# Patient Record
Sex: Female | Born: 1945
Health system: Southern US, Community
[De-identification: ages and names within clinical notes are randomized; demographics above are authoritative.]

## PROBLEM LIST (undated history)

## (undated) DIAGNOSIS — I6782 Cerebral ischemia: Secondary | ICD-10-CM

## (undated) DIAGNOSIS — D329 Benign neoplasm of meninges, unspecified: Secondary | ICD-10-CM

## (undated) DIAGNOSIS — E785 Hyperlipidemia, unspecified: Secondary | ICD-10-CM

## (undated) DIAGNOSIS — E039 Hypothyroidism, unspecified: Secondary | ICD-10-CM

## (undated) DIAGNOSIS — M8580 Other specified disorders of bone density and structure, unspecified site: Secondary | ICD-10-CM

## (undated) DIAGNOSIS — E079 Disorder of thyroid, unspecified: Secondary | ICD-10-CM

## (undated) DIAGNOSIS — I700 Atherosclerosis of aorta: Secondary | ICD-10-CM

## (undated) DIAGNOSIS — I10 Essential (primary) hypertension: Secondary | ICD-10-CM

## (undated) HISTORY — DX: Benign neoplasm of meninges, unspecified: D32.9

## (undated) HISTORY — DX: Essential (primary) hypertension: I10

## (undated) HISTORY — DX: Atherosclerosis of aorta: I70.0

## (undated) HISTORY — DX: Hyperlipidemia, unspecified: E78.5

## (undated) HISTORY — DX: Cerebral ischemia: I67.82

## (undated) HISTORY — PX: BREAST LUMPECTOMY WITH NEEDLE LOCALIZATION AND AXILLARY SENTINEL LYMPH NODE BX: SHX5760

## (undated) HISTORY — DX: Other specified disorders of bone density and structure, unspecified site: M85.80

## (undated) HISTORY — DX: Hypothyroidism, unspecified: E03.9

## (undated) HISTORY — DX: Disorder of thyroid, unspecified: E07.9

---

## 1997-12-11 ENCOUNTER — Other Ambulatory Visit: Admission: RE | Admit: 1997-12-11 | Discharge: 1997-12-11 | Payer: Self-pay | Admitting: Gynecology

## 1999-01-04 ENCOUNTER — Other Ambulatory Visit: Admission: RE | Admit: 1999-01-04 | Discharge: 1999-01-04 | Payer: Self-pay | Admitting: Gynecology

## 2000-01-13 ENCOUNTER — Other Ambulatory Visit: Admission: RE | Admit: 2000-01-13 | Discharge: 2000-01-13 | Payer: Self-pay | Admitting: Obstetrics and Gynecology

## 2000-01-25 ENCOUNTER — Ambulatory Visit (HOSPITAL_COMMUNITY): Admission: RE | Admit: 2000-01-25 | Discharge: 2000-01-25 | Payer: Self-pay | Admitting: Obstetrics and Gynecology

## 2000-01-25 ENCOUNTER — Encounter: Payer: Self-pay | Admitting: Obstetrics and Gynecology

## 2000-12-28 ENCOUNTER — Other Ambulatory Visit: Admission: RE | Admit: 2000-12-28 | Discharge: 2000-12-28 | Payer: Self-pay | Admitting: Gynecology

## 2002-08-19 ENCOUNTER — Other Ambulatory Visit: Admission: RE | Admit: 2002-08-19 | Discharge: 2002-08-19 | Payer: Self-pay | Admitting: Gynecology

## 2003-10-21 ENCOUNTER — Other Ambulatory Visit: Admission: RE | Admit: 2003-10-21 | Discharge: 2003-10-21 | Payer: Self-pay | Admitting: Gynecology

## 2005-03-10 ENCOUNTER — Other Ambulatory Visit: Admission: RE | Admit: 2005-03-10 | Discharge: 2005-03-10 | Payer: Self-pay | Admitting: Gynecology

## 2012-04-20 DIAGNOSIS — M899 Disorder of bone, unspecified: Secondary | ICD-10-CM | POA: Diagnosis not present

## 2012-04-20 DIAGNOSIS — Z1231 Encounter for screening mammogram for malignant neoplasm of breast: Secondary | ICD-10-CM | POA: Diagnosis not present

## 2012-04-20 DIAGNOSIS — Z13820 Encounter for screening for osteoporosis: Secondary | ICD-10-CM | POA: Diagnosis not present

## 2012-04-20 DIAGNOSIS — M949 Disorder of cartilage, unspecified: Secondary | ICD-10-CM | POA: Diagnosis not present

## 2012-05-28 DIAGNOSIS — Z23 Encounter for immunization: Secondary | ICD-10-CM | POA: Diagnosis not present

## 2012-06-07 DIAGNOSIS — E785 Hyperlipidemia, unspecified: Secondary | ICD-10-CM | POA: Diagnosis not present

## 2012-06-07 DIAGNOSIS — M899 Disorder of bone, unspecified: Secondary | ICD-10-CM | POA: Diagnosis not present

## 2012-06-07 DIAGNOSIS — Z79899 Other long term (current) drug therapy: Secondary | ICD-10-CM | POA: Diagnosis not present

## 2012-06-07 DIAGNOSIS — M949 Disorder of cartilage, unspecified: Secondary | ICD-10-CM | POA: Diagnosis not present

## 2012-06-07 DIAGNOSIS — D72819 Decreased white blood cell count, unspecified: Secondary | ICD-10-CM | POA: Diagnosis not present

## 2012-07-26 DIAGNOSIS — I10 Essential (primary) hypertension: Secondary | ICD-10-CM | POA: Diagnosis not present

## 2012-07-26 DIAGNOSIS — Z79899 Other long term (current) drug therapy: Secondary | ICD-10-CM | POA: Diagnosis not present

## 2012-07-26 DIAGNOSIS — E785 Hyperlipidemia, unspecified: Secondary | ICD-10-CM | POA: Diagnosis not present

## 2012-08-23 DIAGNOSIS — H04129 Dry eye syndrome of unspecified lacrimal gland: Secondary | ICD-10-CM | POA: Diagnosis not present

## 2012-08-23 DIAGNOSIS — H524 Presbyopia: Secondary | ICD-10-CM | POA: Diagnosis not present

## 2012-12-06 DIAGNOSIS — E785 Hyperlipidemia, unspecified: Secondary | ICD-10-CM | POA: Diagnosis not present

## 2012-12-06 DIAGNOSIS — I10 Essential (primary) hypertension: Secondary | ICD-10-CM | POA: Diagnosis not present

## 2012-12-06 DIAGNOSIS — Z79899 Other long term (current) drug therapy: Secondary | ICD-10-CM | POA: Diagnosis not present

## 2013-04-18 DIAGNOSIS — M899 Disorder of bone, unspecified: Secondary | ICD-10-CM | POA: Diagnosis not present

## 2013-04-18 DIAGNOSIS — E785 Hyperlipidemia, unspecified: Secondary | ICD-10-CM | POA: Diagnosis not present

## 2013-04-18 DIAGNOSIS — Z1231 Encounter for screening mammogram for malignant neoplasm of breast: Secondary | ICD-10-CM | POA: Diagnosis not present

## 2013-04-18 DIAGNOSIS — Z79899 Other long term (current) drug therapy: Secondary | ICD-10-CM | POA: Diagnosis not present

## 2013-04-18 DIAGNOSIS — I10 Essential (primary) hypertension: Secondary | ICD-10-CM | POA: Diagnosis not present

## 2013-04-18 DIAGNOSIS — Z0000 Encounter for general adult medical examination without abnormal findings: Secondary | ICD-10-CM | POA: Diagnosis not present

## 2013-04-18 DIAGNOSIS — Z124 Encounter for screening for malignant neoplasm of cervix: Secondary | ICD-10-CM | POA: Diagnosis not present

## 2013-04-18 DIAGNOSIS — R7989 Other specified abnormal findings of blood chemistry: Secondary | ICD-10-CM | POA: Diagnosis not present

## 2013-05-30 DIAGNOSIS — Z23 Encounter for immunization: Secondary | ICD-10-CM | POA: Diagnosis not present

## 2013-05-31 DIAGNOSIS — M810 Age-related osteoporosis without current pathological fracture: Secondary | ICD-10-CM | POA: Diagnosis not present

## 2013-05-31 DIAGNOSIS — Z1231 Encounter for screening mammogram for malignant neoplasm of breast: Secondary | ICD-10-CM | POA: Diagnosis not present

## 2013-05-31 DIAGNOSIS — M899 Disorder of bone, unspecified: Secondary | ICD-10-CM | POA: Diagnosis not present

## 2013-06-25 DIAGNOSIS — M810 Age-related osteoporosis without current pathological fracture: Secondary | ICD-10-CM | POA: Diagnosis not present

## 2013-08-22 DIAGNOSIS — E785 Hyperlipidemia, unspecified: Secondary | ICD-10-CM | POA: Diagnosis not present

## 2013-08-22 DIAGNOSIS — Z79899 Other long term (current) drug therapy: Secondary | ICD-10-CM | POA: Diagnosis not present

## 2013-08-22 DIAGNOSIS — I10 Essential (primary) hypertension: Secondary | ICD-10-CM | POA: Diagnosis not present

## 2013-08-22 DIAGNOSIS — M810 Age-related osteoporosis without current pathological fracture: Secondary | ICD-10-CM | POA: Diagnosis not present

## 2013-08-26 DIAGNOSIS — E875 Hyperkalemia: Secondary | ICD-10-CM | POA: Diagnosis not present

## 2013-11-18 DIAGNOSIS — J069 Acute upper respiratory infection, unspecified: Secondary | ICD-10-CM | POA: Diagnosis not present

## 2013-11-25 DIAGNOSIS — N390 Urinary tract infection, site not specified: Secondary | ICD-10-CM | POA: Diagnosis not present

## 2014-02-26 DIAGNOSIS — H547 Unspecified visual loss: Secondary | ICD-10-CM | POA: Diagnosis not present

## 2014-02-26 DIAGNOSIS — H2589 Other age-related cataract: Secondary | ICD-10-CM | POA: Diagnosis not present

## 2014-02-26 DIAGNOSIS — H04129 Dry eye syndrome of unspecified lacrimal gland: Secondary | ICD-10-CM | POA: Diagnosis not present

## 2014-05-29 DIAGNOSIS — M549 Dorsalgia, unspecified: Secondary | ICD-10-CM | POA: Diagnosis not present

## 2014-05-29 DIAGNOSIS — M810 Age-related osteoporosis without current pathological fracture: Secondary | ICD-10-CM | POA: Diagnosis not present

## 2014-05-29 DIAGNOSIS — Z1231 Encounter for screening mammogram for malignant neoplasm of breast: Secondary | ICD-10-CM | POA: Diagnosis not present

## 2014-06-13 DIAGNOSIS — Z1231 Encounter for screening mammogram for malignant neoplasm of breast: Secondary | ICD-10-CM | POA: Diagnosis not present

## 2014-06-13 DIAGNOSIS — M810 Age-related osteoporosis without current pathological fracture: Secondary | ICD-10-CM | POA: Diagnosis not present

## 2014-06-13 DIAGNOSIS — Z780 Asymptomatic menopausal state: Secondary | ICD-10-CM | POA: Diagnosis not present

## 2014-06-27 DIAGNOSIS — Z23 Encounter for immunization: Secondary | ICD-10-CM | POA: Diagnosis not present

## 2014-10-09 DIAGNOSIS — R7301 Impaired fasting glucose: Secondary | ICD-10-CM | POA: Diagnosis not present

## 2014-10-09 DIAGNOSIS — M8580 Other specified disorders of bone density and structure, unspecified site: Secondary | ICD-10-CM | POA: Diagnosis not present

## 2014-10-09 DIAGNOSIS — E785 Hyperlipidemia, unspecified: Secondary | ICD-10-CM | POA: Diagnosis not present

## 2014-10-09 DIAGNOSIS — I10 Essential (primary) hypertension: Secondary | ICD-10-CM | POA: Diagnosis not present

## 2014-10-09 DIAGNOSIS — Z79899 Other long term (current) drug therapy: Secondary | ICD-10-CM | POA: Diagnosis not present

## 2014-12-22 DIAGNOSIS — R946 Abnormal results of thyroid function studies: Secondary | ICD-10-CM | POA: Diagnosis not present

## 2014-12-22 DIAGNOSIS — D72819 Decreased white blood cell count, unspecified: Secondary | ICD-10-CM | POA: Diagnosis not present

## 2014-12-30 DIAGNOSIS — R946 Abnormal results of thyroid function studies: Secondary | ICD-10-CM | POA: Diagnosis not present

## 2015-03-04 DIAGNOSIS — H2513 Age-related nuclear cataract, bilateral: Secondary | ICD-10-CM | POA: Diagnosis not present

## 2015-04-09 DIAGNOSIS — R7301 Impaired fasting glucose: Secondary | ICD-10-CM | POA: Diagnosis not present

## 2015-04-09 DIAGNOSIS — R946 Abnormal results of thyroid function studies: Secondary | ICD-10-CM | POA: Diagnosis not present

## 2015-04-09 DIAGNOSIS — E785 Hyperlipidemia, unspecified: Secondary | ICD-10-CM | POA: Diagnosis not present

## 2015-04-09 DIAGNOSIS — Z79899 Other long term (current) drug therapy: Secondary | ICD-10-CM | POA: Diagnosis not present

## 2015-04-09 DIAGNOSIS — I10 Essential (primary) hypertension: Secondary | ICD-10-CM | POA: Diagnosis not present

## 2015-04-22 DIAGNOSIS — Z23 Encounter for immunization: Secondary | ICD-10-CM | POA: Diagnosis not present

## 2015-04-28 DIAGNOSIS — Z1211 Encounter for screening for malignant neoplasm of colon: Secondary | ICD-10-CM | POA: Diagnosis not present

## 2015-04-28 DIAGNOSIS — Z1212 Encounter for screening for malignant neoplasm of rectum: Secondary | ICD-10-CM | POA: Diagnosis not present

## 2015-05-13 DIAGNOSIS — D1801 Hemangioma of skin and subcutaneous tissue: Secondary | ICD-10-CM | POA: Diagnosis not present

## 2015-05-13 DIAGNOSIS — B351 Tinea unguium: Secondary | ICD-10-CM | POA: Diagnosis not present

## 2015-05-13 DIAGNOSIS — D225 Melanocytic nevi of trunk: Secondary | ICD-10-CM | POA: Diagnosis not present

## 2015-05-13 DIAGNOSIS — D2239 Melanocytic nevi of other parts of face: Secondary | ICD-10-CM | POA: Diagnosis not present

## 2015-05-13 DIAGNOSIS — L820 Inflamed seborrheic keratosis: Secondary | ICD-10-CM | POA: Diagnosis not present

## 2015-06-04 DIAGNOSIS — R859 Unspecified abnormal finding in specimens from digestive organs and abdominal cavity: Secondary | ICD-10-CM | POA: Diagnosis not present

## 2015-06-26 DIAGNOSIS — I10 Essential (primary) hypertension: Secondary | ICD-10-CM | POA: Diagnosis not present

## 2015-06-26 DIAGNOSIS — Z1211 Encounter for screening for malignant neoplasm of colon: Secondary | ICD-10-CM | POA: Diagnosis not present

## 2015-07-10 DIAGNOSIS — J209 Acute bronchitis, unspecified: Secondary | ICD-10-CM | POA: Diagnosis not present

## 2015-07-10 DIAGNOSIS — J0100 Acute maxillary sinusitis, unspecified: Secondary | ICD-10-CM | POA: Diagnosis not present

## 2015-07-30 DIAGNOSIS — Z23 Encounter for immunization: Secondary | ICD-10-CM | POA: Diagnosis not present

## 2015-10-01 DIAGNOSIS — Z1231 Encounter for screening mammogram for malignant neoplasm of breast: Secondary | ICD-10-CM | POA: Diagnosis not present

## 2015-10-07 DIAGNOSIS — R928 Other abnormal and inconclusive findings on diagnostic imaging of breast: Secondary | ICD-10-CM | POA: Diagnosis not present

## 2015-10-12 DIAGNOSIS — E785 Hyperlipidemia, unspecified: Secondary | ICD-10-CM | POA: Diagnosis not present

## 2015-10-12 DIAGNOSIS — Z79899 Other long term (current) drug therapy: Secondary | ICD-10-CM | POA: Diagnosis not present

## 2015-10-12 DIAGNOSIS — I10 Essential (primary) hypertension: Secondary | ICD-10-CM | POA: Diagnosis not present

## 2016-01-14 DIAGNOSIS — E039 Hypothyroidism, unspecified: Secondary | ICD-10-CM | POA: Diagnosis not present

## 2016-01-14 DIAGNOSIS — K130 Diseases of lips: Secondary | ICD-10-CM | POA: Diagnosis not present

## 2016-01-28 DIAGNOSIS — Z79899 Other long term (current) drug therapy: Secondary | ICD-10-CM | POA: Diagnosis not present

## 2016-01-28 DIAGNOSIS — R946 Abnormal results of thyroid function studies: Secondary | ICD-10-CM | POA: Diagnosis not present

## 2016-01-28 DIAGNOSIS — E785 Hyperlipidemia, unspecified: Secondary | ICD-10-CM | POA: Diagnosis not present

## 2016-03-24 DIAGNOSIS — H2513 Age-related nuclear cataract, bilateral: Secondary | ICD-10-CM | POA: Diagnosis not present

## 2016-03-24 DIAGNOSIS — H524 Presbyopia: Secondary | ICD-10-CM | POA: Diagnosis not present

## 2016-04-12 DIAGNOSIS — R946 Abnormal results of thyroid function studies: Secondary | ICD-10-CM | POA: Diagnosis not present

## 2016-04-12 DIAGNOSIS — M810 Age-related osteoporosis without current pathological fracture: Secondary | ICD-10-CM | POA: Diagnosis not present

## 2016-04-12 DIAGNOSIS — E785 Hyperlipidemia, unspecified: Secondary | ICD-10-CM | POA: Diagnosis not present

## 2016-04-12 DIAGNOSIS — Z79899 Other long term (current) drug therapy: Secondary | ICD-10-CM | POA: Diagnosis not present

## 2016-04-12 DIAGNOSIS — R7301 Impaired fasting glucose: Secondary | ICD-10-CM | POA: Diagnosis not present

## 2016-04-12 DIAGNOSIS — I10 Essential (primary) hypertension: Secondary | ICD-10-CM | POA: Diagnosis not present

## 2016-04-13 ENCOUNTER — Other Ambulatory Visit: Payer: Self-pay

## 2016-05-26 DIAGNOSIS — D225 Melanocytic nevi of trunk: Secondary | ICD-10-CM | POA: Diagnosis not present

## 2016-05-26 DIAGNOSIS — D2239 Melanocytic nevi of other parts of face: Secondary | ICD-10-CM | POA: Diagnosis not present

## 2016-05-26 DIAGNOSIS — D1801 Hemangioma of skin and subcutaneous tissue: Secondary | ICD-10-CM | POA: Diagnosis not present

## 2016-05-26 DIAGNOSIS — L821 Other seborrheic keratosis: Secondary | ICD-10-CM | POA: Diagnosis not present

## 2016-05-26 DIAGNOSIS — L820 Inflamed seborrheic keratosis: Secondary | ICD-10-CM | POA: Diagnosis not present

## 2016-10-06 DIAGNOSIS — Z1231 Encounter for screening mammogram for malignant neoplasm of breast: Secondary | ICD-10-CM | POA: Diagnosis not present

## 2016-10-06 DIAGNOSIS — E785 Hyperlipidemia, unspecified: Secondary | ICD-10-CM | POA: Diagnosis not present

## 2016-10-06 DIAGNOSIS — M8580 Other specified disorders of bone density and structure, unspecified site: Secondary | ICD-10-CM | POA: Diagnosis not present

## 2016-10-06 DIAGNOSIS — E2839 Other primary ovarian failure: Secondary | ICD-10-CM | POA: Diagnosis not present

## 2016-10-06 DIAGNOSIS — I10 Essential (primary) hypertension: Secondary | ICD-10-CM | POA: Diagnosis not present

## 2016-10-06 DIAGNOSIS — R946 Abnormal results of thyroid function studies: Secondary | ICD-10-CM | POA: Diagnosis not present

## 2016-10-06 DIAGNOSIS — Z79899 Other long term (current) drug therapy: Secondary | ICD-10-CM | POA: Diagnosis not present

## 2016-10-06 DIAGNOSIS — R7301 Impaired fasting glucose: Secondary | ICD-10-CM | POA: Diagnosis not present

## 2016-10-21 DIAGNOSIS — M85852 Other specified disorders of bone density and structure, left thigh: Secondary | ICD-10-CM | POA: Diagnosis not present

## 2016-10-21 DIAGNOSIS — E2839 Other primary ovarian failure: Secondary | ICD-10-CM | POA: Diagnosis not present

## 2016-10-21 DIAGNOSIS — M8580 Other specified disorders of bone density and structure, unspecified site: Secondary | ICD-10-CM | POA: Diagnosis not present

## 2016-10-21 DIAGNOSIS — Z1231 Encounter for screening mammogram for malignant neoplasm of breast: Secondary | ICD-10-CM | POA: Diagnosis not present

## 2016-11-03 DIAGNOSIS — N6489 Other specified disorders of breast: Secondary | ICD-10-CM | POA: Diagnosis not present

## 2016-11-03 DIAGNOSIS — C50311 Malignant neoplasm of lower-inner quadrant of right female breast: Secondary | ICD-10-CM | POA: Diagnosis not present

## 2016-11-03 DIAGNOSIS — N6314 Unspecified lump in the right breast, lower inner quadrant: Secondary | ICD-10-CM | POA: Diagnosis not present

## 2016-11-03 DIAGNOSIS — R928 Other abnormal and inconclusive findings on diagnostic imaging of breast: Secondary | ICD-10-CM | POA: Diagnosis not present

## 2016-11-04 DIAGNOSIS — C50311 Malignant neoplasm of lower-inner quadrant of right female breast: Secondary | ICD-10-CM

## 2016-11-04 HISTORY — DX: Malignant neoplasm of lower-inner quadrant of right female breast: C50.311

## 2016-11-09 DIAGNOSIS — C50911 Malignant neoplasm of unspecified site of right female breast: Secondary | ICD-10-CM | POA: Diagnosis not present

## 2016-11-15 DIAGNOSIS — Z171 Estrogen receptor negative status [ER-]: Secondary | ICD-10-CM | POA: Diagnosis not present

## 2016-11-15 DIAGNOSIS — C50311 Malignant neoplasm of lower-inner quadrant of right female breast: Secondary | ICD-10-CM | POA: Diagnosis not present

## 2016-11-30 DIAGNOSIS — Z01810 Encounter for preprocedural cardiovascular examination: Secondary | ICD-10-CM | POA: Diagnosis not present

## 2016-11-30 DIAGNOSIS — E079 Disorder of thyroid, unspecified: Secondary | ICD-10-CM | POA: Diagnosis not present

## 2016-11-30 DIAGNOSIS — Z171 Estrogen receptor negative status [ER-]: Secondary | ICD-10-CM | POA: Diagnosis not present

## 2016-11-30 DIAGNOSIS — E7800 Pure hypercholesterolemia, unspecified: Secondary | ICD-10-CM | POA: Diagnosis not present

## 2016-11-30 DIAGNOSIS — Z7982 Long term (current) use of aspirin: Secondary | ICD-10-CM | POA: Diagnosis not present

## 2016-11-30 DIAGNOSIS — C50311 Malignant neoplasm of lower-inner quadrant of right female breast: Secondary | ICD-10-CM | POA: Diagnosis not present

## 2016-11-30 DIAGNOSIS — R928 Other abnormal and inconclusive findings on diagnostic imaging of breast: Secondary | ICD-10-CM | POA: Diagnosis not present

## 2016-11-30 DIAGNOSIS — Z87891 Personal history of nicotine dependence: Secondary | ICD-10-CM | POA: Diagnosis not present

## 2016-11-30 DIAGNOSIS — C50911 Malignant neoplasm of unspecified site of right female breast: Secondary | ICD-10-CM | POA: Diagnosis not present

## 2016-11-30 DIAGNOSIS — I10 Essential (primary) hypertension: Secondary | ICD-10-CM | POA: Diagnosis not present

## 2016-12-16 DIAGNOSIS — E039 Hypothyroidism, unspecified: Secondary | ICD-10-CM | POA: Diagnosis not present

## 2016-12-16 DIAGNOSIS — I10 Essential (primary) hypertension: Secondary | ICD-10-CM | POA: Diagnosis not present

## 2016-12-16 DIAGNOSIS — E785 Hyperlipidemia, unspecified: Secondary | ICD-10-CM | POA: Diagnosis not present

## 2016-12-16 DIAGNOSIS — Z171 Estrogen receptor negative status [ER-]: Secondary | ICD-10-CM | POA: Diagnosis not present

## 2016-12-16 DIAGNOSIS — M8580 Other specified disorders of bone density and structure, unspecified site: Secondary | ICD-10-CM | POA: Diagnosis not present

## 2016-12-16 DIAGNOSIS — C50311 Malignant neoplasm of lower-inner quadrant of right female breast: Secondary | ICD-10-CM | POA: Diagnosis not present

## 2016-12-22 DIAGNOSIS — Z7982 Long term (current) use of aspirin: Secondary | ICD-10-CM | POA: Diagnosis not present

## 2016-12-22 DIAGNOSIS — I10 Essential (primary) hypertension: Secondary | ICD-10-CM | POA: Diagnosis not present

## 2016-12-22 DIAGNOSIS — E079 Disorder of thyroid, unspecified: Secondary | ICD-10-CM | POA: Diagnosis not present

## 2016-12-22 DIAGNOSIS — C50911 Malignant neoplasm of unspecified site of right female breast: Secondary | ICD-10-CM | POA: Diagnosis not present

## 2016-12-22 DIAGNOSIS — Z79899 Other long term (current) drug therapy: Secondary | ICD-10-CM | POA: Diagnosis not present

## 2016-12-22 DIAGNOSIS — E785 Hyperlipidemia, unspecified: Secondary | ICD-10-CM | POA: Diagnosis not present

## 2016-12-22 DIAGNOSIS — Z9889 Other specified postprocedural states: Secondary | ICD-10-CM | POA: Diagnosis not present

## 2016-12-22 DIAGNOSIS — C50311 Malignant neoplasm of lower-inner quadrant of right female breast: Secondary | ICD-10-CM | POA: Diagnosis not present

## 2016-12-26 DIAGNOSIS — C50311 Malignant neoplasm of lower-inner quadrant of right female breast: Secondary | ICD-10-CM | POA: Diagnosis not present

## 2016-12-28 DIAGNOSIS — Z171 Estrogen receptor negative status [ER-]: Secondary | ICD-10-CM | POA: Diagnosis not present

## 2016-12-28 DIAGNOSIS — C50311 Malignant neoplasm of lower-inner quadrant of right female breast: Secondary | ICD-10-CM | POA: Diagnosis not present

## 2016-12-28 DIAGNOSIS — Z7982 Long term (current) use of aspirin: Secondary | ICD-10-CM | POA: Diagnosis not present

## 2016-12-28 DIAGNOSIS — I10 Essential (primary) hypertension: Secondary | ICD-10-CM | POA: Diagnosis not present

## 2016-12-28 DIAGNOSIS — Z452 Encounter for adjustment and management of vascular access device: Secondary | ICD-10-CM | POA: Diagnosis not present

## 2016-12-28 DIAGNOSIS — E7800 Pure hypercholesterolemia, unspecified: Secondary | ICD-10-CM | POA: Diagnosis not present

## 2016-12-28 DIAGNOSIS — C50911 Malignant neoplasm of unspecified site of right female breast: Secondary | ICD-10-CM | POA: Diagnosis not present

## 2016-12-28 DIAGNOSIS — E039 Hypothyroidism, unspecified: Secondary | ICD-10-CM | POA: Diagnosis not present

## 2016-12-28 DIAGNOSIS — Z79899 Other long term (current) drug therapy: Secondary | ICD-10-CM | POA: Diagnosis not present

## 2016-12-30 DIAGNOSIS — C50311 Malignant neoplasm of lower-inner quadrant of right female breast: Secondary | ICD-10-CM | POA: Diagnosis not present

## 2016-12-30 DIAGNOSIS — Z5111 Encounter for antineoplastic chemotherapy: Secondary | ICD-10-CM | POA: Diagnosis not present

## 2017-01-06 DIAGNOSIS — C50911 Malignant neoplasm of unspecified site of right female breast: Secondary | ICD-10-CM | POA: Diagnosis not present

## 2017-01-06 DIAGNOSIS — C50311 Malignant neoplasm of lower-inner quadrant of right female breast: Secondary | ICD-10-CM | POA: Diagnosis not present

## 2017-01-06 DIAGNOSIS — Z171 Estrogen receptor negative status [ER-]: Secondary | ICD-10-CM | POA: Diagnosis not present

## 2017-01-20 DIAGNOSIS — R0609 Other forms of dyspnea: Secondary | ICD-10-CM | POA: Diagnosis not present

## 2017-01-20 DIAGNOSIS — R079 Chest pain, unspecified: Secondary | ICD-10-CM | POA: Diagnosis not present

## 2017-01-20 DIAGNOSIS — D708 Other neutropenia: Secondary | ICD-10-CM | POA: Diagnosis not present

## 2017-01-20 DIAGNOSIS — Z5111 Encounter for antineoplastic chemotherapy: Secondary | ICD-10-CM | POA: Diagnosis not present

## 2017-01-20 DIAGNOSIS — R42 Dizziness and giddiness: Secondary | ICD-10-CM | POA: Diagnosis not present

## 2017-01-20 DIAGNOSIS — C50911 Malignant neoplasm of unspecified site of right female breast: Secondary | ICD-10-CM | POA: Diagnosis not present

## 2017-01-24 DIAGNOSIS — R0602 Shortness of breath: Secondary | ICD-10-CM | POA: Diagnosis not present

## 2017-01-24 DIAGNOSIS — E785 Hyperlipidemia, unspecified: Secondary | ICD-10-CM | POA: Diagnosis not present

## 2017-01-25 DIAGNOSIS — I700 Atherosclerosis of aorta: Secondary | ICD-10-CM | POA: Diagnosis not present

## 2017-01-25 DIAGNOSIS — I2699 Other pulmonary embolism without acute cor pulmonale: Secondary | ICD-10-CM | POA: Diagnosis not present

## 2017-01-25 DIAGNOSIS — R0602 Shortness of breath: Secondary | ICD-10-CM | POA: Diagnosis not present

## 2017-01-25 DIAGNOSIS — Z79899 Other long term (current) drug therapy: Secondary | ICD-10-CM | POA: Diagnosis not present

## 2017-01-25 DIAGNOSIS — R7989 Other specified abnormal findings of blood chemistry: Secondary | ICD-10-CM | POA: Diagnosis not present

## 2017-01-26 DIAGNOSIS — I2699 Other pulmonary embolism without acute cor pulmonale: Secondary | ICD-10-CM | POA: Diagnosis not present

## 2017-02-04 DIAGNOSIS — I2699 Other pulmonary embolism without acute cor pulmonale: Secondary | ICD-10-CM

## 2017-02-04 HISTORY — DX: Other pulmonary embolism without acute cor pulmonale: I26.99

## 2017-02-10 DIAGNOSIS — C50311 Malignant neoplasm of lower-inner quadrant of right female breast: Secondary | ICD-10-CM | POA: Diagnosis not present

## 2017-02-10 DIAGNOSIS — Z7901 Long term (current) use of anticoagulants: Secondary | ICD-10-CM | POA: Diagnosis not present

## 2017-02-10 DIAGNOSIS — I2699 Other pulmonary embolism without acute cor pulmonale: Secondary | ICD-10-CM | POA: Diagnosis not present

## 2017-03-03 DIAGNOSIS — D6481 Anemia due to antineoplastic chemotherapy: Secondary | ICD-10-CM | POA: Diagnosis not present

## 2017-03-03 DIAGNOSIS — D649 Anemia, unspecified: Secondary | ICD-10-CM | POA: Diagnosis not present

## 2017-03-03 DIAGNOSIS — I2699 Other pulmonary embolism without acute cor pulmonale: Secondary | ICD-10-CM | POA: Diagnosis not present

## 2017-03-03 DIAGNOSIS — C50311 Malignant neoplasm of lower-inner quadrant of right female breast: Secondary | ICD-10-CM | POA: Diagnosis not present

## 2017-03-03 DIAGNOSIS — C50911 Malignant neoplasm of unspecified site of right female breast: Secondary | ICD-10-CM | POA: Diagnosis not present

## 2017-03-03 DIAGNOSIS — N61 Mastitis without abscess: Secondary | ICD-10-CM | POA: Diagnosis not present

## 2017-03-14 DIAGNOSIS — I2699 Other pulmonary embolism without acute cor pulmonale: Secondary | ICD-10-CM | POA: Diagnosis not present

## 2017-03-14 DIAGNOSIS — C50311 Malignant neoplasm of lower-inner quadrant of right female breast: Secondary | ICD-10-CM | POA: Diagnosis not present

## 2017-03-14 DIAGNOSIS — Z7901 Long term (current) use of anticoagulants: Secondary | ICD-10-CM | POA: Diagnosis not present

## 2017-03-21 DIAGNOSIS — Z171 Estrogen receptor negative status [ER-]: Secondary | ICD-10-CM | POA: Diagnosis not present

## 2017-03-21 DIAGNOSIS — I700 Atherosclerosis of aorta: Secondary | ICD-10-CM | POA: Diagnosis not present

## 2017-03-21 DIAGNOSIS — Z452 Encounter for adjustment and management of vascular access device: Secondary | ICD-10-CM | POA: Diagnosis not present

## 2017-03-21 DIAGNOSIS — C50311 Malignant neoplasm of lower-inner quadrant of right female breast: Secondary | ICD-10-CM | POA: Diagnosis not present

## 2017-03-21 DIAGNOSIS — R0602 Shortness of breath: Secondary | ICD-10-CM | POA: Diagnosis not present

## 2017-03-22 DIAGNOSIS — R0602 Shortness of breath: Secondary | ICD-10-CM | POA: Diagnosis not present

## 2017-03-22 DIAGNOSIS — C50311 Malignant neoplasm of lower-inner quadrant of right female breast: Secondary | ICD-10-CM | POA: Diagnosis not present

## 2017-03-23 DIAGNOSIS — C50311 Malignant neoplasm of lower-inner quadrant of right female breast: Secondary | ICD-10-CM | POA: Diagnosis not present

## 2017-03-23 DIAGNOSIS — R0602 Shortness of breath: Secondary | ICD-10-CM | POA: Diagnosis not present

## 2017-03-24 DIAGNOSIS — Z510 Encounter for antineoplastic radiation therapy: Secondary | ICD-10-CM | POA: Diagnosis not present

## 2017-03-24 DIAGNOSIS — C50311 Malignant neoplasm of lower-inner quadrant of right female breast: Secondary | ICD-10-CM | POA: Diagnosis not present

## 2017-03-27 DIAGNOSIS — C50311 Malignant neoplasm of lower-inner quadrant of right female breast: Secondary | ICD-10-CM | POA: Diagnosis not present

## 2017-03-31 DIAGNOSIS — Z510 Encounter for antineoplastic radiation therapy: Secondary | ICD-10-CM | POA: Diagnosis not present

## 2017-03-31 DIAGNOSIS — C50311 Malignant neoplasm of lower-inner quadrant of right female breast: Secondary | ICD-10-CM | POA: Diagnosis not present

## 2017-04-03 DIAGNOSIS — N61 Mastitis without abscess: Secondary | ICD-10-CM | POA: Diagnosis not present

## 2017-04-03 DIAGNOSIS — C50911 Malignant neoplasm of unspecified site of right female breast: Secondary | ICD-10-CM | POA: Diagnosis not present

## 2017-04-03 DIAGNOSIS — Z7901 Long term (current) use of anticoagulants: Secondary | ICD-10-CM | POA: Diagnosis not present

## 2017-04-03 DIAGNOSIS — Z86711 Personal history of pulmonary embolism: Secondary | ICD-10-CM

## 2017-04-03 DIAGNOSIS — Z9221 Personal history of antineoplastic chemotherapy: Secondary | ICD-10-CM

## 2017-04-03 DIAGNOSIS — R21 Rash and other nonspecific skin eruption: Secondary | ICD-10-CM | POA: Diagnosis not present

## 2017-04-03 DIAGNOSIS — F419 Anxiety disorder, unspecified: Secondary | ICD-10-CM | POA: Diagnosis not present

## 2017-04-03 DIAGNOSIS — C50311 Malignant neoplasm of lower-inner quadrant of right female breast: Secondary | ICD-10-CM | POA: Diagnosis not present

## 2017-04-03 DIAGNOSIS — Z171 Estrogen receptor negative status [ER-]: Secondary | ICD-10-CM | POA: Diagnosis not present

## 2017-04-04 DIAGNOSIS — M25511 Pain in right shoulder: Secondary | ICD-10-CM | POA: Diagnosis not present

## 2017-04-04 DIAGNOSIS — M25611 Stiffness of right shoulder, not elsewhere classified: Secondary | ICD-10-CM | POA: Diagnosis not present

## 2017-04-04 DIAGNOSIS — R2681 Unsteadiness on feet: Secondary | ICD-10-CM | POA: Diagnosis not present

## 2017-04-04 DIAGNOSIS — C50311 Malignant neoplasm of lower-inner quadrant of right female breast: Secondary | ICD-10-CM | POA: Diagnosis not present

## 2017-04-04 DIAGNOSIS — M6281 Muscle weakness (generalized): Secondary | ICD-10-CM | POA: Diagnosis not present

## 2017-04-04 DIAGNOSIS — R5383 Other fatigue: Secondary | ICD-10-CM | POA: Diagnosis not present

## 2017-04-04 DIAGNOSIS — Z510 Encounter for antineoplastic radiation therapy: Secondary | ICD-10-CM | POA: Diagnosis not present

## 2017-04-05 DIAGNOSIS — M6281 Muscle weakness (generalized): Secondary | ICD-10-CM | POA: Diagnosis not present

## 2017-04-05 DIAGNOSIS — Z510 Encounter for antineoplastic radiation therapy: Secondary | ICD-10-CM | POA: Diagnosis not present

## 2017-04-05 DIAGNOSIS — M25511 Pain in right shoulder: Secondary | ICD-10-CM | POA: Diagnosis not present

## 2017-04-05 DIAGNOSIS — R5383 Other fatigue: Secondary | ICD-10-CM | POA: Diagnosis not present

## 2017-04-05 DIAGNOSIS — C50311 Malignant neoplasm of lower-inner quadrant of right female breast: Secondary | ICD-10-CM | POA: Diagnosis not present

## 2017-04-05 DIAGNOSIS — M25611 Stiffness of right shoulder, not elsewhere classified: Secondary | ICD-10-CM | POA: Diagnosis not present

## 2017-04-06 DIAGNOSIS — M6281 Muscle weakness (generalized): Secondary | ICD-10-CM | POA: Diagnosis not present

## 2017-04-06 DIAGNOSIS — H1132 Conjunctival hemorrhage, left eye: Secondary | ICD-10-CM | POA: Diagnosis not present

## 2017-04-06 DIAGNOSIS — Z510 Encounter for antineoplastic radiation therapy: Secondary | ICD-10-CM | POA: Diagnosis not present

## 2017-04-06 DIAGNOSIS — R5383 Other fatigue: Secondary | ICD-10-CM | POA: Diagnosis not present

## 2017-04-06 DIAGNOSIS — M25611 Stiffness of right shoulder, not elsewhere classified: Secondary | ICD-10-CM | POA: Diagnosis not present

## 2017-04-06 DIAGNOSIS — M25511 Pain in right shoulder: Secondary | ICD-10-CM | POA: Diagnosis not present

## 2017-04-06 DIAGNOSIS — C50311 Malignant neoplasm of lower-inner quadrant of right female breast: Secondary | ICD-10-CM | POA: Diagnosis not present

## 2017-04-07 DIAGNOSIS — M6281 Muscle weakness (generalized): Secondary | ICD-10-CM | POA: Diagnosis not present

## 2017-04-07 DIAGNOSIS — M25511 Pain in right shoulder: Secondary | ICD-10-CM | POA: Diagnosis not present

## 2017-04-07 DIAGNOSIS — R5383 Other fatigue: Secondary | ICD-10-CM | POA: Diagnosis not present

## 2017-04-07 DIAGNOSIS — M25611 Stiffness of right shoulder, not elsewhere classified: Secondary | ICD-10-CM | POA: Diagnosis not present

## 2017-04-07 DIAGNOSIS — Z510 Encounter for antineoplastic radiation therapy: Secondary | ICD-10-CM | POA: Diagnosis not present

## 2017-04-07 DIAGNOSIS — C50311 Malignant neoplasm of lower-inner quadrant of right female breast: Secondary | ICD-10-CM | POA: Diagnosis not present

## 2017-04-10 DIAGNOSIS — C50311 Malignant neoplasm of lower-inner quadrant of right female breast: Secondary | ICD-10-CM | POA: Diagnosis not present

## 2017-04-10 DIAGNOSIS — I10 Essential (primary) hypertension: Secondary | ICD-10-CM | POA: Diagnosis not present

## 2017-04-10 DIAGNOSIS — M25611 Stiffness of right shoulder, not elsewhere classified: Secondary | ICD-10-CM | POA: Diagnosis not present

## 2017-04-10 DIAGNOSIS — Z0001 Encounter for general adult medical examination with abnormal findings: Secondary | ICD-10-CM | POA: Diagnosis not present

## 2017-04-10 DIAGNOSIS — M25511 Pain in right shoulder: Secondary | ICD-10-CM | POA: Diagnosis not present

## 2017-04-10 DIAGNOSIS — Z510 Encounter for antineoplastic radiation therapy: Secondary | ICD-10-CM | POA: Diagnosis not present

## 2017-04-10 DIAGNOSIS — E785 Hyperlipidemia, unspecified: Secondary | ICD-10-CM | POA: Diagnosis not present

## 2017-04-10 DIAGNOSIS — M6281 Muscle weakness (generalized): Secondary | ICD-10-CM | POA: Diagnosis not present

## 2017-04-10 DIAGNOSIS — L509 Urticaria, unspecified: Secondary | ICD-10-CM | POA: Diagnosis not present

## 2017-04-10 DIAGNOSIS — Z1389 Encounter for screening for other disorder: Secondary | ICD-10-CM | POA: Diagnosis not present

## 2017-04-10 DIAGNOSIS — R5383 Other fatigue: Secondary | ICD-10-CM | POA: Diagnosis not present

## 2017-04-11 DIAGNOSIS — R5383 Other fatigue: Secondary | ICD-10-CM | POA: Diagnosis not present

## 2017-04-11 DIAGNOSIS — M6281 Muscle weakness (generalized): Secondary | ICD-10-CM | POA: Diagnosis not present

## 2017-04-11 DIAGNOSIS — M25511 Pain in right shoulder: Secondary | ICD-10-CM | POA: Diagnosis not present

## 2017-04-11 DIAGNOSIS — C50311 Malignant neoplasm of lower-inner quadrant of right female breast: Secondary | ICD-10-CM | POA: Diagnosis not present

## 2017-04-11 DIAGNOSIS — Z510 Encounter for antineoplastic radiation therapy: Secondary | ICD-10-CM | POA: Diagnosis not present

## 2017-04-11 DIAGNOSIS — M25611 Stiffness of right shoulder, not elsewhere classified: Secondary | ICD-10-CM | POA: Diagnosis not present

## 2017-04-12 DIAGNOSIS — M25611 Stiffness of right shoulder, not elsewhere classified: Secondary | ICD-10-CM | POA: Diagnosis not present

## 2017-04-12 DIAGNOSIS — M25511 Pain in right shoulder: Secondary | ICD-10-CM | POA: Diagnosis not present

## 2017-04-12 DIAGNOSIS — M6281 Muscle weakness (generalized): Secondary | ICD-10-CM | POA: Diagnosis not present

## 2017-04-12 DIAGNOSIS — Z510 Encounter for antineoplastic radiation therapy: Secondary | ICD-10-CM | POA: Diagnosis not present

## 2017-04-12 DIAGNOSIS — R5383 Other fatigue: Secondary | ICD-10-CM | POA: Diagnosis not present

## 2017-04-12 DIAGNOSIS — C50311 Malignant neoplasm of lower-inner quadrant of right female breast: Secondary | ICD-10-CM | POA: Diagnosis not present

## 2017-04-13 DIAGNOSIS — Z510 Encounter for antineoplastic radiation therapy: Secondary | ICD-10-CM | POA: Diagnosis not present

## 2017-04-13 DIAGNOSIS — C50311 Malignant neoplasm of lower-inner quadrant of right female breast: Secondary | ICD-10-CM | POA: Diagnosis not present

## 2017-04-13 DIAGNOSIS — M25511 Pain in right shoulder: Secondary | ICD-10-CM | POA: Diagnosis not present

## 2017-04-13 DIAGNOSIS — M6281 Muscle weakness (generalized): Secondary | ICD-10-CM | POA: Diagnosis not present

## 2017-04-13 DIAGNOSIS — M25611 Stiffness of right shoulder, not elsewhere classified: Secondary | ICD-10-CM | POA: Diagnosis not present

## 2017-04-13 DIAGNOSIS — R5383 Other fatigue: Secondary | ICD-10-CM | POA: Diagnosis not present

## 2017-04-14 DIAGNOSIS — M25511 Pain in right shoulder: Secondary | ICD-10-CM | POA: Diagnosis not present

## 2017-04-14 DIAGNOSIS — M6281 Muscle weakness (generalized): Secondary | ICD-10-CM | POA: Diagnosis not present

## 2017-04-14 DIAGNOSIS — Z510 Encounter for antineoplastic radiation therapy: Secondary | ICD-10-CM | POA: Diagnosis not present

## 2017-04-14 DIAGNOSIS — M25611 Stiffness of right shoulder, not elsewhere classified: Secondary | ICD-10-CM | POA: Diagnosis not present

## 2017-04-14 DIAGNOSIS — C50311 Malignant neoplasm of lower-inner quadrant of right female breast: Secondary | ICD-10-CM | POA: Diagnosis not present

## 2017-04-14 DIAGNOSIS — R5383 Other fatigue: Secondary | ICD-10-CM | POA: Diagnosis not present

## 2017-04-18 DIAGNOSIS — C50311 Malignant neoplasm of lower-inner quadrant of right female breast: Secondary | ICD-10-CM | POA: Diagnosis not present

## 2017-04-18 DIAGNOSIS — Z510 Encounter for antineoplastic radiation therapy: Secondary | ICD-10-CM | POA: Diagnosis not present

## 2017-04-19 DIAGNOSIS — R5383 Other fatigue: Secondary | ICD-10-CM | POA: Diagnosis not present

## 2017-04-19 DIAGNOSIS — M25611 Stiffness of right shoulder, not elsewhere classified: Secondary | ICD-10-CM | POA: Diagnosis not present

## 2017-04-19 DIAGNOSIS — Z510 Encounter for antineoplastic radiation therapy: Secondary | ICD-10-CM | POA: Diagnosis not present

## 2017-04-19 DIAGNOSIS — M6281 Muscle weakness (generalized): Secondary | ICD-10-CM | POA: Diagnosis not present

## 2017-04-19 DIAGNOSIS — C50311 Malignant neoplasm of lower-inner quadrant of right female breast: Secondary | ICD-10-CM | POA: Diagnosis not present

## 2017-04-19 DIAGNOSIS — M25511 Pain in right shoulder: Secondary | ICD-10-CM | POA: Diagnosis not present

## 2017-04-19 DIAGNOSIS — R2681 Unsteadiness on feet: Secondary | ICD-10-CM | POA: Diagnosis not present

## 2017-04-20 DIAGNOSIS — C50311 Malignant neoplasm of lower-inner quadrant of right female breast: Secondary | ICD-10-CM | POA: Diagnosis not present

## 2017-04-20 DIAGNOSIS — Z510 Encounter for antineoplastic radiation therapy: Secondary | ICD-10-CM | POA: Diagnosis not present

## 2017-04-21 DIAGNOSIS — C50311 Malignant neoplasm of lower-inner quadrant of right female breast: Secondary | ICD-10-CM | POA: Diagnosis not present

## 2017-04-21 DIAGNOSIS — R5383 Other fatigue: Secondary | ICD-10-CM | POA: Diagnosis not present

## 2017-04-21 DIAGNOSIS — R2681 Unsteadiness on feet: Secondary | ICD-10-CM | POA: Diagnosis not present

## 2017-04-21 DIAGNOSIS — M25511 Pain in right shoulder: Secondary | ICD-10-CM | POA: Diagnosis not present

## 2017-04-21 DIAGNOSIS — Z510 Encounter for antineoplastic radiation therapy: Secondary | ICD-10-CM | POA: Diagnosis not present

## 2017-04-21 DIAGNOSIS — M6281 Muscle weakness (generalized): Secondary | ICD-10-CM | POA: Diagnosis not present

## 2017-04-21 DIAGNOSIS — M25611 Stiffness of right shoulder, not elsewhere classified: Secondary | ICD-10-CM | POA: Diagnosis not present

## 2017-04-24 DIAGNOSIS — C50311 Malignant neoplasm of lower-inner quadrant of right female breast: Secondary | ICD-10-CM | POA: Diagnosis not present

## 2017-04-24 DIAGNOSIS — Z510 Encounter for antineoplastic radiation therapy: Secondary | ICD-10-CM | POA: Diagnosis not present

## 2017-04-25 DIAGNOSIS — M6281 Muscle weakness (generalized): Secondary | ICD-10-CM | POA: Diagnosis not present

## 2017-04-25 DIAGNOSIS — M25511 Pain in right shoulder: Secondary | ICD-10-CM | POA: Diagnosis not present

## 2017-04-25 DIAGNOSIS — Z510 Encounter for antineoplastic radiation therapy: Secondary | ICD-10-CM | POA: Diagnosis not present

## 2017-04-25 DIAGNOSIS — R5383 Other fatigue: Secondary | ICD-10-CM | POA: Diagnosis not present

## 2017-04-25 DIAGNOSIS — M25611 Stiffness of right shoulder, not elsewhere classified: Secondary | ICD-10-CM | POA: Diagnosis not present

## 2017-04-25 DIAGNOSIS — C50311 Malignant neoplasm of lower-inner quadrant of right female breast: Secondary | ICD-10-CM | POA: Diagnosis not present

## 2017-04-25 DIAGNOSIS — R2681 Unsteadiness on feet: Secondary | ICD-10-CM | POA: Diagnosis not present

## 2017-04-26 DIAGNOSIS — C50311 Malignant neoplasm of lower-inner quadrant of right female breast: Secondary | ICD-10-CM | POA: Diagnosis not present

## 2017-04-26 DIAGNOSIS — Z510 Encounter for antineoplastic radiation therapy: Secondary | ICD-10-CM | POA: Diagnosis not present

## 2017-04-27 DIAGNOSIS — C50311 Malignant neoplasm of lower-inner quadrant of right female breast: Secondary | ICD-10-CM | POA: Diagnosis not present

## 2017-04-27 DIAGNOSIS — R5383 Other fatigue: Secondary | ICD-10-CM | POA: Diagnosis not present

## 2017-04-27 DIAGNOSIS — M25511 Pain in right shoulder: Secondary | ICD-10-CM | POA: Diagnosis not present

## 2017-04-27 DIAGNOSIS — M25611 Stiffness of right shoulder, not elsewhere classified: Secondary | ICD-10-CM | POA: Diagnosis not present

## 2017-04-27 DIAGNOSIS — R2681 Unsteadiness on feet: Secondary | ICD-10-CM | POA: Diagnosis not present

## 2017-04-27 DIAGNOSIS — M6281 Muscle weakness (generalized): Secondary | ICD-10-CM | POA: Diagnosis not present

## 2017-05-01 DIAGNOSIS — C50311 Malignant neoplasm of lower-inner quadrant of right female breast: Secondary | ICD-10-CM | POA: Diagnosis not present

## 2017-05-01 DIAGNOSIS — Z510 Encounter for antineoplastic radiation therapy: Secondary | ICD-10-CM | POA: Diagnosis not present

## 2017-05-02 DIAGNOSIS — M25511 Pain in right shoulder: Secondary | ICD-10-CM | POA: Diagnosis not present

## 2017-05-02 DIAGNOSIS — R5383 Other fatigue: Secondary | ICD-10-CM | POA: Diagnosis not present

## 2017-05-02 DIAGNOSIS — M6281 Muscle weakness (generalized): Secondary | ICD-10-CM | POA: Diagnosis not present

## 2017-05-02 DIAGNOSIS — C50311 Malignant neoplasm of lower-inner quadrant of right female breast: Secondary | ICD-10-CM | POA: Diagnosis not present

## 2017-05-02 DIAGNOSIS — Z510 Encounter for antineoplastic radiation therapy: Secondary | ICD-10-CM | POA: Diagnosis not present

## 2017-05-02 DIAGNOSIS — Z171 Estrogen receptor negative status [ER-]: Secondary | ICD-10-CM | POA: Diagnosis not present

## 2017-05-02 DIAGNOSIS — M25611 Stiffness of right shoulder, not elsewhere classified: Secondary | ICD-10-CM | POA: Diagnosis not present

## 2017-05-02 DIAGNOSIS — R2681 Unsteadiness on feet: Secondary | ICD-10-CM | POA: Diagnosis not present

## 2017-05-02 DIAGNOSIS — Z452 Encounter for adjustment and management of vascular access device: Secondary | ICD-10-CM | POA: Diagnosis not present

## 2017-05-03 DIAGNOSIS — Z510 Encounter for antineoplastic radiation therapy: Secondary | ICD-10-CM | POA: Diagnosis not present

## 2017-05-03 DIAGNOSIS — C50311 Malignant neoplasm of lower-inner quadrant of right female breast: Secondary | ICD-10-CM | POA: Diagnosis not present

## 2017-05-04 DIAGNOSIS — M6281 Muscle weakness (generalized): Secondary | ICD-10-CM | POA: Diagnosis not present

## 2017-05-04 DIAGNOSIS — Z510 Encounter for antineoplastic radiation therapy: Secondary | ICD-10-CM | POA: Diagnosis not present

## 2017-05-04 DIAGNOSIS — M25511 Pain in right shoulder: Secondary | ICD-10-CM | POA: Diagnosis not present

## 2017-05-04 DIAGNOSIS — M25611 Stiffness of right shoulder, not elsewhere classified: Secondary | ICD-10-CM | POA: Diagnosis not present

## 2017-05-04 DIAGNOSIS — C50311 Malignant neoplasm of lower-inner quadrant of right female breast: Secondary | ICD-10-CM | POA: Diagnosis not present

## 2017-05-04 DIAGNOSIS — R5383 Other fatigue: Secondary | ICD-10-CM | POA: Diagnosis not present

## 2017-05-04 DIAGNOSIS — R2681 Unsteadiness on feet: Secondary | ICD-10-CM | POA: Diagnosis not present

## 2017-05-09 DIAGNOSIS — R2681 Unsteadiness on feet: Secondary | ICD-10-CM | POA: Diagnosis not present

## 2017-05-09 DIAGNOSIS — M6281 Muscle weakness (generalized): Secondary | ICD-10-CM | POA: Diagnosis not present

## 2017-05-09 DIAGNOSIS — C50311 Malignant neoplasm of lower-inner quadrant of right female breast: Secondary | ICD-10-CM | POA: Diagnosis not present

## 2017-05-09 DIAGNOSIS — M25611 Stiffness of right shoulder, not elsewhere classified: Secondary | ICD-10-CM | POA: Diagnosis not present

## 2017-05-09 DIAGNOSIS — R5383 Other fatigue: Secondary | ICD-10-CM | POA: Diagnosis not present

## 2017-05-09 DIAGNOSIS — M25511 Pain in right shoulder: Secondary | ICD-10-CM | POA: Diagnosis not present

## 2017-05-10 DIAGNOSIS — E7800 Pure hypercholesterolemia, unspecified: Secondary | ICD-10-CM | POA: Diagnosis not present

## 2017-05-10 DIAGNOSIS — I10 Essential (primary) hypertension: Secondary | ICD-10-CM | POA: Diagnosis not present

## 2017-05-10 DIAGNOSIS — E079 Disorder of thyroid, unspecified: Secondary | ICD-10-CM | POA: Diagnosis not present

## 2017-05-10 DIAGNOSIS — Z7982 Long term (current) use of aspirin: Secondary | ICD-10-CM | POA: Diagnosis not present

## 2017-05-10 DIAGNOSIS — Z452 Encounter for adjustment and management of vascular access device: Secondary | ICD-10-CM | POA: Diagnosis not present

## 2017-05-10 DIAGNOSIS — Z853 Personal history of malignant neoplasm of breast: Secondary | ICD-10-CM | POA: Diagnosis not present

## 2017-05-10 DIAGNOSIS — Z79899 Other long term (current) drug therapy: Secondary | ICD-10-CM | POA: Diagnosis not present

## 2017-05-10 DIAGNOSIS — Z171 Estrogen receptor negative status [ER-]: Secondary | ICD-10-CM | POA: Diagnosis not present

## 2017-05-12 DIAGNOSIS — L509 Urticaria, unspecified: Secondary | ICD-10-CM | POA: Diagnosis not present

## 2017-05-25 DIAGNOSIS — C50311 Malignant neoplasm of lower-inner quadrant of right female breast: Secondary | ICD-10-CM | POA: Diagnosis not present

## 2017-06-01 DIAGNOSIS — H25813 Combined forms of age-related cataract, bilateral: Secondary | ICD-10-CM | POA: Diagnosis not present

## 2017-06-07 DIAGNOSIS — Z23 Encounter for immunization: Secondary | ICD-10-CM | POA: Diagnosis not present

## 2017-06-07 DIAGNOSIS — Z86711 Personal history of pulmonary embolism: Secondary | ICD-10-CM | POA: Diagnosis not present

## 2017-06-07 DIAGNOSIS — F410 Panic disorder [episodic paroxysmal anxiety] without agoraphobia: Secondary | ICD-10-CM | POA: Diagnosis not present

## 2017-06-07 DIAGNOSIS — Z171 Estrogen receptor negative status [ER-]: Secondary | ICD-10-CM | POA: Diagnosis not present

## 2017-06-07 DIAGNOSIS — Z9221 Personal history of antineoplastic chemotherapy: Secondary | ICD-10-CM | POA: Diagnosis not present

## 2017-06-07 DIAGNOSIS — Z923 Personal history of irradiation: Secondary | ICD-10-CM | POA: Diagnosis not present

## 2017-06-07 DIAGNOSIS — C50911 Malignant neoplasm of unspecified site of right female breast: Secondary | ICD-10-CM | POA: Diagnosis not present

## 2017-06-07 DIAGNOSIS — C50311 Malignant neoplasm of lower-inner quadrant of right female breast: Secondary | ICD-10-CM | POA: Diagnosis not present

## 2017-06-13 DIAGNOSIS — T8149XA Infection following a procedure, other surgical site, initial encounter: Secondary | ICD-10-CM | POA: Diagnosis not present

## 2017-06-15 DIAGNOSIS — M25611 Stiffness of right shoulder, not elsewhere classified: Secondary | ICD-10-CM | POA: Diagnosis not present

## 2017-06-15 DIAGNOSIS — R5383 Other fatigue: Secondary | ICD-10-CM | POA: Diagnosis not present

## 2017-06-15 DIAGNOSIS — M25511 Pain in right shoulder: Secondary | ICD-10-CM | POA: Diagnosis not present

## 2017-06-15 DIAGNOSIS — R2681 Unsteadiness on feet: Secondary | ICD-10-CM | POA: Diagnosis not present

## 2017-06-15 DIAGNOSIS — M6281 Muscle weakness (generalized): Secondary | ICD-10-CM | POA: Diagnosis not present

## 2017-06-15 DIAGNOSIS — C50311 Malignant neoplasm of lower-inner quadrant of right female breast: Secondary | ICD-10-CM | POA: Diagnosis not present

## 2017-06-20 DIAGNOSIS — M25611 Stiffness of right shoulder, not elsewhere classified: Secondary | ICD-10-CM | POA: Diagnosis not present

## 2017-06-20 DIAGNOSIS — M6281 Muscle weakness (generalized): Secondary | ICD-10-CM | POA: Diagnosis not present

## 2017-06-20 DIAGNOSIS — M25511 Pain in right shoulder: Secondary | ICD-10-CM | POA: Diagnosis not present

## 2017-06-20 DIAGNOSIS — R2681 Unsteadiness on feet: Secondary | ICD-10-CM | POA: Diagnosis not present

## 2017-06-20 DIAGNOSIS — R5383 Other fatigue: Secondary | ICD-10-CM | POA: Diagnosis not present

## 2017-06-20 DIAGNOSIS — C50311 Malignant neoplasm of lower-inner quadrant of right female breast: Secondary | ICD-10-CM | POA: Diagnosis not present

## 2017-06-22 DIAGNOSIS — M25611 Stiffness of right shoulder, not elsewhere classified: Secondary | ICD-10-CM | POA: Diagnosis not present

## 2017-06-22 DIAGNOSIS — R5383 Other fatigue: Secondary | ICD-10-CM | POA: Diagnosis not present

## 2017-06-22 DIAGNOSIS — R2681 Unsteadiness on feet: Secondary | ICD-10-CM | POA: Diagnosis not present

## 2017-06-22 DIAGNOSIS — M25511 Pain in right shoulder: Secondary | ICD-10-CM | POA: Diagnosis not present

## 2017-06-22 DIAGNOSIS — M6281 Muscle weakness (generalized): Secondary | ICD-10-CM | POA: Diagnosis not present

## 2017-06-22 DIAGNOSIS — C50311 Malignant neoplasm of lower-inner quadrant of right female breast: Secondary | ICD-10-CM | POA: Diagnosis not present

## 2017-06-26 DIAGNOSIS — M25511 Pain in right shoulder: Secondary | ICD-10-CM | POA: Diagnosis not present

## 2017-06-26 DIAGNOSIS — R5383 Other fatigue: Secondary | ICD-10-CM | POA: Diagnosis not present

## 2017-06-26 DIAGNOSIS — M25611 Stiffness of right shoulder, not elsewhere classified: Secondary | ICD-10-CM | POA: Diagnosis not present

## 2017-06-26 DIAGNOSIS — R2681 Unsteadiness on feet: Secondary | ICD-10-CM | POA: Diagnosis not present

## 2017-06-26 DIAGNOSIS — C50311 Malignant neoplasm of lower-inner quadrant of right female breast: Secondary | ICD-10-CM | POA: Diagnosis not present

## 2017-06-26 DIAGNOSIS — M6281 Muscle weakness (generalized): Secondary | ICD-10-CM | POA: Diagnosis not present

## 2017-07-11 DIAGNOSIS — M25611 Stiffness of right shoulder, not elsewhere classified: Secondary | ICD-10-CM | POA: Diagnosis not present

## 2017-07-11 DIAGNOSIS — R2681 Unsteadiness on feet: Secondary | ICD-10-CM | POA: Diagnosis not present

## 2017-07-11 DIAGNOSIS — R5383 Other fatigue: Secondary | ICD-10-CM | POA: Diagnosis not present

## 2017-07-11 DIAGNOSIS — M25511 Pain in right shoulder: Secondary | ICD-10-CM | POA: Diagnosis not present

## 2017-07-11 DIAGNOSIS — M6281 Muscle weakness (generalized): Secondary | ICD-10-CM | POA: Diagnosis not present

## 2017-07-11 DIAGNOSIS — C50311 Malignant neoplasm of lower-inner quadrant of right female breast: Secondary | ICD-10-CM | POA: Diagnosis not present

## 2017-09-05 DIAGNOSIS — Z01818 Encounter for other preprocedural examination: Secondary | ICD-10-CM | POA: Diagnosis not present

## 2017-09-05 DIAGNOSIS — H40033 Anatomical narrow angle, bilateral: Secondary | ICD-10-CM | POA: Diagnosis not present

## 2017-09-05 DIAGNOSIS — H25811 Combined forms of age-related cataract, right eye: Secondary | ICD-10-CM | POA: Diagnosis not present

## 2017-09-07 DIAGNOSIS — Z86711 Personal history of pulmonary embolism: Secondary | ICD-10-CM | POA: Diagnosis not present

## 2017-09-07 DIAGNOSIS — Z853 Personal history of malignant neoplasm of breast: Secondary | ICD-10-CM | POA: Diagnosis not present

## 2017-09-07 DIAGNOSIS — Z7982 Long term (current) use of aspirin: Secondary | ICD-10-CM | POA: Diagnosis not present

## 2017-09-12 DIAGNOSIS — H40033 Anatomical narrow angle, bilateral: Secondary | ICD-10-CM | POA: Diagnosis not present

## 2017-09-12 DIAGNOSIS — E785 Hyperlipidemia, unspecified: Secondary | ICD-10-CM | POA: Diagnosis not present

## 2017-09-12 DIAGNOSIS — E039 Hypothyroidism, unspecified: Secondary | ICD-10-CM | POA: Diagnosis not present

## 2017-09-12 DIAGNOSIS — M810 Age-related osteoporosis without current pathological fracture: Secondary | ICD-10-CM | POA: Diagnosis not present

## 2017-09-12 DIAGNOSIS — Z7982 Long term (current) use of aspirin: Secondary | ICD-10-CM | POA: Diagnosis not present

## 2017-09-12 DIAGNOSIS — H25811 Combined forms of age-related cataract, right eye: Secondary | ICD-10-CM | POA: Diagnosis not present

## 2017-09-12 DIAGNOSIS — H04123 Dry eye syndrome of bilateral lacrimal glands: Secondary | ICD-10-CM | POA: Diagnosis not present

## 2017-09-12 DIAGNOSIS — I10 Essential (primary) hypertension: Secondary | ICD-10-CM | POA: Diagnosis not present

## 2017-09-12 DIAGNOSIS — H259 Unspecified age-related cataract: Secondary | ICD-10-CM | POA: Diagnosis not present

## 2017-09-12 DIAGNOSIS — M1990 Unspecified osteoarthritis, unspecified site: Secondary | ICD-10-CM | POA: Diagnosis not present

## 2017-09-12 DIAGNOSIS — Z79899 Other long term (current) drug therapy: Secondary | ICD-10-CM | POA: Diagnosis not present

## 2017-10-09 DIAGNOSIS — Z961 Presence of intraocular lens: Secondary | ICD-10-CM | POA: Diagnosis not present

## 2017-10-10 DIAGNOSIS — E785 Hyperlipidemia, unspecified: Secondary | ICD-10-CM | POA: Diagnosis not present

## 2017-10-10 DIAGNOSIS — I10 Essential (primary) hypertension: Secondary | ICD-10-CM | POA: Diagnosis not present

## 2017-10-10 DIAGNOSIS — Z79899 Other long term (current) drug therapy: Secondary | ICD-10-CM | POA: Diagnosis not present

## 2017-10-10 DIAGNOSIS — M8580 Other specified disorders of bone density and structure, unspecified site: Secondary | ICD-10-CM | POA: Diagnosis not present

## 2017-10-10 DIAGNOSIS — R946 Abnormal results of thyroid function studies: Secondary | ICD-10-CM | POA: Diagnosis not present

## 2017-10-10 DIAGNOSIS — R7301 Impaired fasting glucose: Secondary | ICD-10-CM | POA: Diagnosis not present

## 2017-10-19 DIAGNOSIS — H1011 Acute atopic conjunctivitis, right eye: Secondary | ICD-10-CM | POA: Diagnosis not present

## 2017-10-23 DIAGNOSIS — R928 Other abnormal and inconclusive findings on diagnostic imaging of breast: Secondary | ICD-10-CM | POA: Diagnosis not present

## 2017-10-23 DIAGNOSIS — Z853 Personal history of malignant neoplasm of breast: Secondary | ICD-10-CM | POA: Diagnosis not present

## 2017-11-13 DIAGNOSIS — C50311 Malignant neoplasm of lower-inner quadrant of right female breast: Secondary | ICD-10-CM | POA: Diagnosis not present

## 2017-11-13 DIAGNOSIS — Z171 Estrogen receptor negative status [ER-]: Secondary | ICD-10-CM | POA: Diagnosis not present

## 2017-12-06 DIAGNOSIS — Z86711 Personal history of pulmonary embolism: Secondary | ICD-10-CM | POA: Diagnosis not present

## 2017-12-06 DIAGNOSIS — Z853 Personal history of malignant neoplasm of breast: Secondary | ICD-10-CM | POA: Diagnosis not present

## 2017-12-06 DIAGNOSIS — Z7982 Long term (current) use of aspirin: Secondary | ICD-10-CM | POA: Diagnosis not present

## 2018-03-16 DIAGNOSIS — R944 Abnormal results of kidney function studies: Secondary | ICD-10-CM | POA: Diagnosis not present

## 2018-03-16 DIAGNOSIS — Z923 Personal history of irradiation: Secondary | ICD-10-CM | POA: Diagnosis not present

## 2018-03-16 DIAGNOSIS — Z9221 Personal history of antineoplastic chemotherapy: Secondary | ICD-10-CM | POA: Diagnosis not present

## 2018-03-16 DIAGNOSIS — Z853 Personal history of malignant neoplasm of breast: Secondary | ICD-10-CM | POA: Diagnosis not present

## 2018-04-09 DIAGNOSIS — Z79899 Other long term (current) drug therapy: Secondary | ICD-10-CM | POA: Diagnosis not present

## 2018-04-09 DIAGNOSIS — Z1389 Encounter for screening for other disorder: Secondary | ICD-10-CM | POA: Diagnosis not present

## 2018-04-09 DIAGNOSIS — Z131 Encounter for screening for diabetes mellitus: Secondary | ICD-10-CM | POA: Diagnosis not present

## 2018-04-09 DIAGNOSIS — Z0000 Encounter for general adult medical examination without abnormal findings: Secondary | ICD-10-CM | POA: Diagnosis not present

## 2018-04-09 DIAGNOSIS — Z1331 Encounter for screening for depression: Secondary | ICD-10-CM | POA: Diagnosis not present

## 2018-04-09 DIAGNOSIS — Z6821 Body mass index (BMI) 21.0-21.9, adult: Secondary | ICD-10-CM | POA: Diagnosis not present

## 2018-04-09 DIAGNOSIS — Z13220 Encounter for screening for lipoid disorders: Secondary | ICD-10-CM | POA: Diagnosis not present

## 2018-06-06 DIAGNOSIS — Z23 Encounter for immunization: Secondary | ICD-10-CM | POA: Diagnosis not present

## 2018-06-13 DIAGNOSIS — Z853 Personal history of malignant neoplasm of breast: Secondary | ICD-10-CM | POA: Diagnosis not present

## 2018-09-13 DIAGNOSIS — Z853 Personal history of malignant neoplasm of breast: Secondary | ICD-10-CM | POA: Diagnosis not present

## 2018-09-13 DIAGNOSIS — Z9221 Personal history of antineoplastic chemotherapy: Secondary | ICD-10-CM | POA: Diagnosis not present

## 2018-09-13 DIAGNOSIS — Z923 Personal history of irradiation: Secondary | ICD-10-CM | POA: Diagnosis not present

## 2018-10-09 DIAGNOSIS — R7301 Impaired fasting glucose: Secondary | ICD-10-CM | POA: Diagnosis not present

## 2018-10-09 DIAGNOSIS — E785 Hyperlipidemia, unspecified: Secondary | ICD-10-CM | POA: Diagnosis not present

## 2018-10-09 DIAGNOSIS — E2839 Other primary ovarian failure: Secondary | ICD-10-CM | POA: Diagnosis not present

## 2018-10-09 DIAGNOSIS — N644 Mastodynia: Secondary | ICD-10-CM | POA: Diagnosis not present

## 2018-10-09 DIAGNOSIS — M8580 Other specified disorders of bone density and structure, unspecified site: Secondary | ICD-10-CM | POA: Diagnosis not present

## 2018-10-09 DIAGNOSIS — Z79899 Other long term (current) drug therapy: Secondary | ICD-10-CM | POA: Diagnosis not present

## 2018-10-09 DIAGNOSIS — Z6821 Body mass index (BMI) 21.0-21.9, adult: Secondary | ICD-10-CM | POA: Diagnosis not present

## 2018-10-09 DIAGNOSIS — I10 Essential (primary) hypertension: Secondary | ICD-10-CM | POA: Diagnosis not present

## 2018-10-15 DIAGNOSIS — H25812 Combined forms of age-related cataract, left eye: Secondary | ICD-10-CM | POA: Diagnosis not present

## 2018-10-23 DIAGNOSIS — I10 Essential (primary) hypertension: Secondary | ICD-10-CM | POA: Diagnosis not present

## 2018-10-23 DIAGNOSIS — R7989 Other specified abnormal findings of blood chemistry: Secondary | ICD-10-CM | POA: Diagnosis not present

## 2018-10-29 DIAGNOSIS — M8580 Other specified disorders of bone density and structure, unspecified site: Secondary | ICD-10-CM | POA: Diagnosis not present

## 2018-10-29 DIAGNOSIS — Z9889 Other specified postprocedural states: Secondary | ICD-10-CM | POA: Diagnosis not present

## 2018-10-29 DIAGNOSIS — M85852 Other specified disorders of bone density and structure, left thigh: Secondary | ICD-10-CM | POA: Diagnosis not present

## 2018-10-29 DIAGNOSIS — E2839 Other primary ovarian failure: Secondary | ICD-10-CM | POA: Diagnosis not present

## 2018-10-29 DIAGNOSIS — R928 Other abnormal and inconclusive findings on diagnostic imaging of breast: Secondary | ICD-10-CM | POA: Diagnosis not present

## 2018-10-29 DIAGNOSIS — C50311 Malignant neoplasm of lower-inner quadrant of right female breast: Secondary | ICD-10-CM | POA: Diagnosis not present

## 2018-12-05 DIAGNOSIS — Z79899 Other long term (current) drug therapy: Secondary | ICD-10-CM | POA: Diagnosis not present

## 2018-12-12 DIAGNOSIS — E875 Hyperkalemia: Secondary | ICD-10-CM | POA: Diagnosis not present

## 2019-01-10 DIAGNOSIS — Z853 Personal history of malignant neoplasm of breast: Secondary | ICD-10-CM | POA: Diagnosis not present

## 2019-02-26 DIAGNOSIS — Z171 Estrogen receptor negative status [ER-]: Secondary | ICD-10-CM | POA: Diagnosis not present

## 2019-02-26 DIAGNOSIS — C50311 Malignant neoplasm of lower-inner quadrant of right female breast: Secondary | ICD-10-CM | POA: Diagnosis not present

## 2019-04-11 DIAGNOSIS — Z1331 Encounter for screening for depression: Secondary | ICD-10-CM | POA: Diagnosis not present

## 2019-04-11 DIAGNOSIS — M6740 Ganglion, unspecified site: Secondary | ICD-10-CM | POA: Diagnosis not present

## 2019-04-11 DIAGNOSIS — Z79899 Other long term (current) drug therapy: Secondary | ICD-10-CM | POA: Diagnosis not present

## 2019-04-11 DIAGNOSIS — Z0000 Encounter for general adult medical examination without abnormal findings: Secondary | ICD-10-CM | POA: Diagnosis not present

## 2019-04-11 DIAGNOSIS — I10 Essential (primary) hypertension: Secondary | ICD-10-CM | POA: Diagnosis not present

## 2019-04-11 DIAGNOSIS — L509 Urticaria, unspecified: Secondary | ICD-10-CM | POA: Diagnosis not present

## 2019-04-11 DIAGNOSIS — R7301 Impaired fasting glucose: Secondary | ICD-10-CM | POA: Diagnosis not present

## 2019-04-11 DIAGNOSIS — Z6821 Body mass index (BMI) 21.0-21.9, adult: Secondary | ICD-10-CM | POA: Diagnosis not present

## 2019-04-11 DIAGNOSIS — E785 Hyperlipidemia, unspecified: Secondary | ICD-10-CM | POA: Diagnosis not present

## 2019-04-11 DIAGNOSIS — Z1339 Encounter for screening examination for other mental health and behavioral disorders: Secondary | ICD-10-CM | POA: Diagnosis not present

## 2019-05-13 DIAGNOSIS — N644 Mastodynia: Secondary | ICD-10-CM | POA: Diagnosis not present

## 2019-05-13 DIAGNOSIS — Z853 Personal history of malignant neoplasm of breast: Secondary | ICD-10-CM | POA: Diagnosis not present

## 2019-05-13 DIAGNOSIS — C50311 Malignant neoplasm of lower-inner quadrant of right female breast: Secondary | ICD-10-CM

## 2019-05-13 DIAGNOSIS — I2699 Other pulmonary embolism without acute cor pulmonale: Secondary | ICD-10-CM

## 2019-05-20 DIAGNOSIS — Z23 Encounter for immunization: Secondary | ICD-10-CM | POA: Diagnosis not present

## 2019-06-11 DIAGNOSIS — D225 Melanocytic nevi of trunk: Secondary | ICD-10-CM | POA: Diagnosis not present

## 2019-06-11 DIAGNOSIS — L300 Nummular dermatitis: Secondary | ICD-10-CM | POA: Diagnosis not present

## 2019-06-11 DIAGNOSIS — L814 Other melanin hyperpigmentation: Secondary | ICD-10-CM | POA: Diagnosis not present

## 2019-06-11 DIAGNOSIS — D2239 Melanocytic nevi of other parts of face: Secondary | ICD-10-CM | POA: Diagnosis not present

## 2019-10-08 DIAGNOSIS — I700 Atherosclerosis of aorta: Secondary | ICD-10-CM | POA: Diagnosis not present

## 2019-10-08 DIAGNOSIS — I10 Essential (primary) hypertension: Secondary | ICD-10-CM | POA: Diagnosis not present

## 2019-10-08 DIAGNOSIS — R946 Abnormal results of thyroid function studies: Secondary | ICD-10-CM | POA: Diagnosis not present

## 2019-10-08 DIAGNOSIS — Z79899 Other long term (current) drug therapy: Secondary | ICD-10-CM | POA: Diagnosis not present

## 2019-10-08 DIAGNOSIS — E785 Hyperlipidemia, unspecified: Secondary | ICD-10-CM | POA: Diagnosis not present

## 2019-10-08 DIAGNOSIS — Z6821 Body mass index (BMI) 21.0-21.9, adult: Secondary | ICD-10-CM | POA: Diagnosis not present

## 2019-10-08 DIAGNOSIS — Z1331 Encounter for screening for depression: Secondary | ICD-10-CM | POA: Diagnosis not present

## 2019-10-08 DIAGNOSIS — R7301 Impaired fasting glucose: Secondary | ICD-10-CM | POA: Diagnosis not present

## 2019-10-23 DIAGNOSIS — H25812 Combined forms of age-related cataract, left eye: Secondary | ICD-10-CM | POA: Diagnosis not present

## 2019-10-23 DIAGNOSIS — H524 Presbyopia: Secondary | ICD-10-CM | POA: Diagnosis not present

## 2019-11-12 DIAGNOSIS — Z853 Personal history of malignant neoplasm of breast: Secondary | ICD-10-CM | POA: Diagnosis not present

## 2019-11-27 DIAGNOSIS — C50311 Malignant neoplasm of lower-inner quadrant of right female breast: Secondary | ICD-10-CM | POA: Diagnosis not present

## 2019-11-27 DIAGNOSIS — R928 Other abnormal and inconclusive findings on diagnostic imaging of breast: Secondary | ICD-10-CM | POA: Diagnosis not present

## 2019-11-27 DIAGNOSIS — N6311 Unspecified lump in the right breast, upper outer quadrant: Secondary | ICD-10-CM | POA: Diagnosis not present

## 2019-12-04 DIAGNOSIS — N641 Fat necrosis of breast: Secondary | ICD-10-CM | POA: Diagnosis not present

## 2019-12-04 DIAGNOSIS — N6311 Unspecified lump in the right breast, upper outer quadrant: Secondary | ICD-10-CM | POA: Diagnosis not present

## 2019-12-04 DIAGNOSIS — R928 Other abnormal and inconclusive findings on diagnostic imaging of breast: Secondary | ICD-10-CM | POA: Diagnosis not present

## 2019-12-16 DIAGNOSIS — C50311 Malignant neoplasm of lower-inner quadrant of right female breast: Secondary | ICD-10-CM | POA: Diagnosis not present

## 2019-12-16 DIAGNOSIS — R928 Other abnormal and inconclusive findings on diagnostic imaging of breast: Secondary | ICD-10-CM | POA: Insufficient documentation

## 2019-12-16 DIAGNOSIS — Z171 Estrogen receptor negative status [ER-]: Secondary | ICD-10-CM | POA: Diagnosis not present

## 2019-12-16 HISTORY — DX: Other abnormal and inconclusive findings on diagnostic imaging of breast: R92.8

## 2020-02-21 DIAGNOSIS — C50311 Malignant neoplasm of lower-inner quadrant of right female breast: Secondary | ICD-10-CM | POA: Diagnosis not present

## 2020-03-05 DIAGNOSIS — N6312 Unspecified lump in the right breast, upper inner quadrant: Secondary | ICD-10-CM | POA: Diagnosis not present

## 2020-03-05 DIAGNOSIS — N649 Disorder of breast, unspecified: Secondary | ICD-10-CM | POA: Diagnosis not present

## 2020-03-05 DIAGNOSIS — R922 Inconclusive mammogram: Secondary | ICD-10-CM | POA: Diagnosis not present

## 2020-03-11 DIAGNOSIS — N641 Fat necrosis of breast: Secondary | ICD-10-CM | POA: Diagnosis not present

## 2020-03-11 DIAGNOSIS — R928 Other abnormal and inconclusive findings on diagnostic imaging of breast: Secondary | ICD-10-CM | POA: Diagnosis not present

## 2020-03-11 DIAGNOSIS — N6312 Unspecified lump in the right breast, upper inner quadrant: Secondary | ICD-10-CM | POA: Diagnosis not present

## 2020-04-16 DIAGNOSIS — U071 COVID-19: Secondary | ICD-10-CM | POA: Diagnosis not present

## 2020-06-02 DIAGNOSIS — C50311 Malignant neoplasm of lower-inner quadrant of right female breast: Secondary | ICD-10-CM | POA: Diagnosis not present

## 2020-06-04 DIAGNOSIS — Z0001 Encounter for general adult medical examination with abnormal findings: Secondary | ICD-10-CM | POA: Diagnosis not present

## 2020-06-04 DIAGNOSIS — Z6820 Body mass index (BMI) 20.0-20.9, adult: Secondary | ICD-10-CM | POA: Diagnosis not present

## 2020-06-04 DIAGNOSIS — Z1339 Encounter for screening examination for other mental health and behavioral disorders: Secondary | ICD-10-CM | POA: Diagnosis not present

## 2020-06-04 DIAGNOSIS — E2839 Other primary ovarian failure: Secondary | ICD-10-CM | POA: Diagnosis not present

## 2020-06-04 DIAGNOSIS — Z1159 Encounter for screening for other viral diseases: Secondary | ICD-10-CM | POA: Diagnosis not present

## 2020-06-04 DIAGNOSIS — L659 Nonscarring hair loss, unspecified: Secondary | ICD-10-CM | POA: Diagnosis not present

## 2020-06-10 DIAGNOSIS — D1801 Hemangioma of skin and subcutaneous tissue: Secondary | ICD-10-CM | POA: Diagnosis not present

## 2020-06-10 DIAGNOSIS — D2239 Melanocytic nevi of other parts of face: Secondary | ICD-10-CM | POA: Diagnosis not present

## 2020-06-10 DIAGNOSIS — D225 Melanocytic nevi of trunk: Secondary | ICD-10-CM | POA: Diagnosis not present

## 2020-06-10 DIAGNOSIS — L814 Other melanin hyperpigmentation: Secondary | ICD-10-CM | POA: Diagnosis not present

## 2020-07-31 DIAGNOSIS — Z23 Encounter for immunization: Secondary | ICD-10-CM | POA: Diagnosis not present

## 2020-10-05 NOTE — Progress Notes (Signed)
Brianna Leonard  [address] [place],  [place]  [address] [phone]  Clinic Day:  10/07/2020  Referring physician: Ernestene Kiel, MD  This document serves as a record of services personally performed by Hosie Poisson, MD. It was created on their behalf by Curry,Lauren E, a trained medical scribe. The creation of this record is based on the scribe's personal observations and the provider's statements to them.  CHIEF COMPLAINT:  CC: Stage IA triple negative right breast cancer  Current Treatment:  Surveillance   HISTORY OF PRESENT ILLNESS:  Brianna Leonard is a 75 y.o. female with stage IA (T1b N0 M0) triple negative right breast cancer diagnosed in March 2018.  She was treated with lumpectomy.  Pathology revealed a 1 cm, grade 2, invasive ductal carcinoma.  One sentinel node was negative for metastasis.  Estrogen and progesterone receptors were negative.  HER 2 by IHC was equivocal, but HER 2 by FISH was negative.  Adjuvant chemotherapy was recommended with 4 cycles of docetaxel and cyclophosphamide beginning in May 2018.  Prior to her second cycle in June, she reported episodes of shortness of breath not necessarily associated with exertion, as well as intermittent chest tightness, which occasionally woke her at night, and a sensation of her heart racing.    EKG was normal and she was asymptomatic, so we advised her to see Dr. Laqueta Due if she had recurrent symptoms.  Unfortunately, she did have recurrent dyspnea and CT chest in June revealed a right lower lobe subsegmental pulmonary embolism.  Dr. Laqueta Due placed the patient on rivaroxaban 15 mg twice daily.  The patient then developed a diffuse rash with cephalexin, which she was on for mastitis.  She has had chronic pain in the right breast since surgery.  In August 2018, she reported shortness of breath and a heaviness in her chest again.  CTA chest at that time revealed resolution of the previously  seen pulmonary embolism.  Rivaroxaban was discontinued and she was placed on aspirin to 81 mg daily.  She received adjuvant radiation to the right breast, completed in September 2018.  Her port was subsequently removed.  When she was seen in March 2021, she was doing fairly well.  She had a bilateral diagnostic mammogram in April, which revealed a new area asymmetry with associated calcifications in the lateral right breast.  There were postoperative changes at the right lumpectomy site with an associated seroma, which were stable.  Ultrasound revealed a hypoechoic mass in the right lateral breast, measuring 8 mm in greatest dimension, at 9:30 o'clock 4 cm from the nipple.  Biopsy was recommended.  Pathology revealed breast and adipose tissue with fat necrosis and dense fibrosis.  No atypia, in situ or invasive malignancy was identified.  In July she reported a mass in the right breast near her surgical excision.  Diagnostic right mammogram from July revealed an indeterminate adjacent masses in the upper inner quadrant of the right breast at the 12:30 o'clock position and the 1 o'clock position.  Biopsies from July 28th confirmed fat necrosis with fibrosis and were benign.  She did contract COVID back in September.    INTERVAL HISTORY:  Brianna Leonard is is here for routine follow up and has been doing fairly well since her last visit.  She continues to have occasional pain of the right breast.  She also had a fall 3 weeks ago but did not obtain serious injury.  Otherwise, she denies complaints.  She undergoes routine lab  work with Dr. Laqueta Due, and had some done yesterday.  She will be due for annual bilateral mammogram and follow up with Dr. Noberto Retort in April.  Her  appetite is good, and she has gained nearly 3 pounds since her last visit.  She denies fever, chills or other signs of infection.  She denies nausea, vomiting, bowel issues, or abdominal pain.  She denies sore throat, cough, dyspnea, or chest pain.  REVIEW  OF SYSTEMS:  Review of Systems  Constitutional: Negative.  Negative for appetite change, chills, fatigue, fever and unexpected weight change.  HENT:  Negative.   Eyes: Negative.   Respiratory: Negative.  Negative for chest tightness, cough, hemoptysis, shortness of breath and wheezing.   Cardiovascular: Negative.  Negative for chest pain, leg swelling and palpitations.  Gastrointestinal: Negative.  Negative for abdominal distention, abdominal pain, blood in stool, constipation, diarrhea, nausea and vomiting.  Endocrine: Negative.   Genitourinary: Negative.  Negative for difficulty urinating, dysuria, frequency and hematuria.   Musculoskeletal: Negative for arthralgias, back pain, flank pain, gait problem and myalgias.       Occasional right breast pain  Skin: Negative.   Neurological: Negative.  Negative for dizziness, extremity weakness, gait problem, headaches, light-headedness, numbness, seizures and speech difficulty.  Hematological: Negative.   Psychiatric/Behavioral: Negative.  Negative for depression and sleep disturbance. The patient is not nervous/anxious.      VITALS:  Blood pressure (!) 171/82, pulse 85, temperature 98 F (36.7 C), temperature source Oral, resp. rate 18, height 5\' 4"  (1.626 m), weight 121 lb 8 oz (55.1 kg), SpO2 94 %.  Wt Readings from Last 3 Encounters:  10/07/20 121 lb 8 oz (55.1 kg)    Body mass index is 20.86 kg/m.  Performance status (ECOG): 1 - Symptomatic but completely ambulatory  PHYSICAL EXAM:  Physical Exam Constitutional:      General: She is not in acute distress.    Appearance: Normal appearance. She is normal weight.  HENT:     Head: Normocephalic and atraumatic.  Eyes:     General: No scleral icterus.    Extraocular Movements: Extraocular movements intact.     Conjunctiva/sclera: Conjunctivae normal.     Pupils: Pupils are equal, round, and reactive to light.  Cardiovascular:     Rate and Rhythm: Normal rate and regular rhythm.      Pulses: Normal pulses.     Heart sounds: Normal heart sounds. No murmur heard. No friction rub. No gallop.   Pulmonary:     Effort: Pulmonary effort is normal. No respiratory distress.     Breath sounds: Normal breath sounds.  Chest:     Comments: She has a well healed scar in the lower inner quadrant of the right breast with an area of seroma lateral and superior to that.  This is tender to palpation.  She also has a firm area in the upper outer quadrant which is tender.  No masses in either breast.  She has some mild erythema of the center of the right breast, primarily at 3 o'clock, which is stable to improved Abdominal:     General: Bowel sounds are normal. There is no distension.     Palpations: Abdomen is soft. There is no mass.     Tenderness: There is no abdominal tenderness.  Musculoskeletal:        General: Normal range of motion.     Cervical back: Normal range of motion and neck supple.     Right lower leg: No edema.  Left lower leg: No edema.  Lymphadenopathy:     Cervical: No cervical adenopathy.  Skin:    General: Skin is warm and dry.  Neurological:     General: No focal deficit present.     Mental Status: She is alert and oriented to person, place, and time. Mental status is at baseline.  Psychiatric:        Mood and Affect: Mood normal.        Behavior: Behavior normal.        Thought Content: Thought content normal.        Judgment: Judgment normal.     LABS:  No flowsheet data found. No flowsheet data found.  STUDIES:  No results found.   Allergies:  Allergies  Allergen Reactions  . Rivaroxaban Rash  . Sulfamethoxazole Rash    Current Medications: Current Outpatient Medications  Medication Sig Dispense Refill  . amLODipine (NORVASC) 5 MG tablet Take 5 mg by mouth daily.    Marland Kitchen ascorbic acid (VITAMIN C) 500 MG tablet Take by mouth.    Marland Kitchen aspirin 81 MG EC tablet Take by mouth.    . Biotin 5000 MCG TABS Take by mouth.    Marland Kitchen CALCIUM PO Take 905 mg  by mouth.    . Omega-3 1000 MG CAPS Take by mouth.    Marland Kitchen OVER THE COUNTER MEDICATION Vivisal Hair Vitamin instead of Collagen    . rosuvastatin (CRESTOR) 5 MG tablet Take by mouth.    . THYROID PO Take by mouth. Thyroid Support Herbal Supplement    . Turmeric 500 MG TABS Take 1,000 mg by mouth.    . Vitamin D, Cholecalciferol, 25 MCG (1000 UT) TABS Take 3,000 Units by mouth.    . zinc gluconate 50 MG tablet Take 50 mg by mouth daily.     No current facility-administered medications for this visit.     ASSESSMENT & PLAN:   Assessment:   1.  Stage IA triple negative right breast cancer, diagnosed in March 2018.  She remains without evidence of recurrence.  2.  Biopsies of the right breast from March 2021 and July were consistent with fat necrosis at 1 o'clock and 12:30 o'clock.  Plan: We will see her back in 6 months for repeat examination.  She will be due for annual mammography in April, which is scheduled through Dr. Noberto Retort.  The patient understands the plans discussed today and is in agreement with them.  She knows to contact our office if she develops concerns prior to her next appointment.   I provided 20 minutes of face-to-face time during this this encounter and > 50% was spent counseling as documented under my assessment and plan.    Derwood Kaplan, MD [facility] AT [facility] [address] [place] [address] Dept: [phone] Dept Fax: [phone]   I, Rita Ohara, am acting as scribe for Derwood Kaplan, MD  I have reviewed this report as typed by the medical scribe, and it is complete and accurate.  Hermina Barters

## 2020-10-06 DIAGNOSIS — E785 Hyperlipidemia, unspecified: Secondary | ICD-10-CM | POA: Diagnosis not present

## 2020-10-06 DIAGNOSIS — L659 Nonscarring hair loss, unspecified: Secondary | ICD-10-CM | POA: Diagnosis not present

## 2020-10-06 DIAGNOSIS — Z1331 Encounter for screening for depression: Secondary | ICD-10-CM | POA: Diagnosis not present

## 2020-10-06 DIAGNOSIS — R7301 Impaired fasting glucose: Secondary | ICD-10-CM | POA: Diagnosis not present

## 2020-10-06 DIAGNOSIS — I10 Essential (primary) hypertension: Secondary | ICD-10-CM | POA: Diagnosis not present

## 2020-10-06 DIAGNOSIS — Z6820 Body mass index (BMI) 20.0-20.9, adult: Secondary | ICD-10-CM | POA: Diagnosis not present

## 2020-10-06 DIAGNOSIS — Z79899 Other long term (current) drug therapy: Secondary | ICD-10-CM | POA: Diagnosis not present

## 2020-10-07 ENCOUNTER — Inpatient Hospital Stay: Payer: Medicare Other | Attending: Oncology | Admitting: Oncology

## 2020-10-07 ENCOUNTER — Telehealth: Payer: Self-pay | Admitting: Oncology

## 2020-10-07 ENCOUNTER — Other Ambulatory Visit: Payer: Self-pay

## 2020-10-07 ENCOUNTER — Encounter: Payer: Self-pay | Admitting: Oncology

## 2020-10-07 VITALS — BP 171/82 | HR 85 | Temp 98.0°F | Resp 18 | Ht 64.0 in | Wt 121.5 lb

## 2020-10-07 DIAGNOSIS — C50311 Malignant neoplasm of lower-inner quadrant of right female breast: Secondary | ICD-10-CM

## 2020-10-07 DIAGNOSIS — Z171 Estrogen receptor negative status [ER-]: Secondary | ICD-10-CM

## 2020-10-07 DIAGNOSIS — I2694 Multiple subsegmental pulmonary emboli without acute cor pulmonale: Secondary | ICD-10-CM | POA: Diagnosis not present

## 2020-10-07 NOTE — Telephone Encounter (Signed)
Per 2/23 LOS, patient scheduled for Aug Appt's.  Gave patient Appt Summary

## 2020-10-28 DIAGNOSIS — H524 Presbyopia: Secondary | ICD-10-CM | POA: Diagnosis not present

## 2020-10-28 DIAGNOSIS — H25812 Combined forms of age-related cataract, left eye: Secondary | ICD-10-CM | POA: Diagnosis not present

## 2020-12-02 DIAGNOSIS — C50311 Malignant neoplasm of lower-inner quadrant of right female breast: Secondary | ICD-10-CM | POA: Diagnosis not present

## 2020-12-02 DIAGNOSIS — Z853 Personal history of malignant neoplasm of breast: Secondary | ICD-10-CM | POA: Diagnosis not present

## 2020-12-02 DIAGNOSIS — R922 Inconclusive mammogram: Secondary | ICD-10-CM | POA: Diagnosis not present

## 2020-12-04 ENCOUNTER — Other Ambulatory Visit: Payer: Self-pay | Admitting: Vascular Surgery

## 2020-12-04 DIAGNOSIS — R928 Other abnormal and inconclusive findings on diagnostic imaging of breast: Secondary | ICD-10-CM

## 2020-12-20 ENCOUNTER — Ambulatory Visit
Admission: RE | Admit: 2020-12-20 | Discharge: 2020-12-20 | Disposition: A | Discharge status: Home / Self Care | Payer: Medicare Other | Source: Ambulatory Visit | Attending: Vascular Surgery | Admitting: Vascular Surgery

## 2020-12-20 ENCOUNTER — Other Ambulatory Visit: Payer: Self-pay

## 2020-12-20 DIAGNOSIS — N6312 Unspecified lump in the right breast, upper inner quadrant: Secondary | ICD-10-CM | POA: Diagnosis not present

## 2020-12-20 DIAGNOSIS — R928 Other abnormal and inconclusive findings on diagnostic imaging of breast: Secondary | ICD-10-CM

## 2020-12-20 IMAGING — MR MR BREAST BILAT WO/W CM
8 of 14 series · 30 of 48 positions shown · IV contrast (Multihance)
Comparison: Previous exams.

CLINICAL DATA: 74-year-old female with history of right breast
cancer post lumpectomy [DATE] presents for evaluation of masses
identified on recent diagnostic mammography/ultrasound [DATE]
suspected to be related to fat necrosis. A mass in the [DATE] position
of the right breast was biopsied [DATE] with pathology revealing
fat necrosis. Additional irregular masses biopsied in the right
breast at the 1 o'clock position and [DATE] positions [DATE] also
revealed fat necrosis. Patient also reports right breast tenderness.

LABS:  Not applicable.
EXAM:
BILATERAL BREAST MRI WITH AND WITHOUT CONTRAST
TECHNIQUE: Multiplanar, multisequence MR images of both breasts were obtained
prior to and following the intravenous administration of 6 ml of
Gadavist

[Series 2: t2_tirm_tra ipat (a-p) · axial · 3.0mm · 0.66mm/px · 1 of 58 slices shown]
[im 1/58]
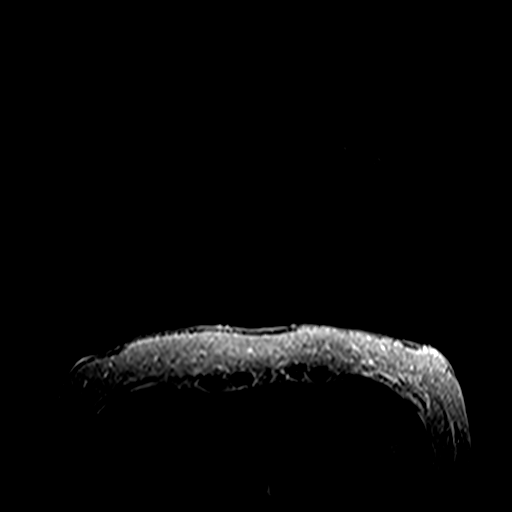

[Series 3: fl3d pre-cm no · axial · non-contrast · 1.2mm · 0.89mm/px · z∈[-61,+111]mm · 5 of 144 slices shown]
[im 1/144]
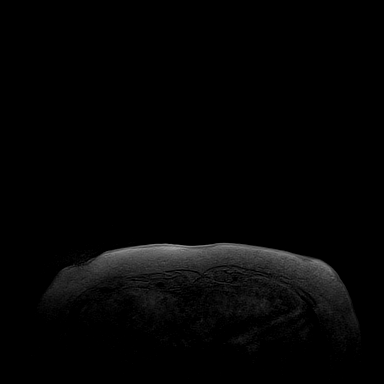
[im 36/144]
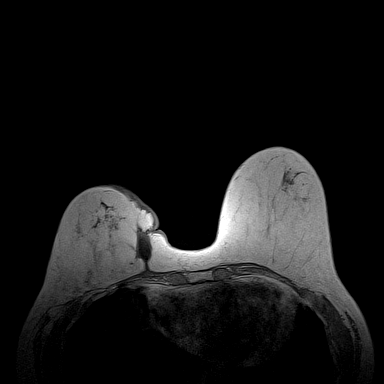
[im 72/144]
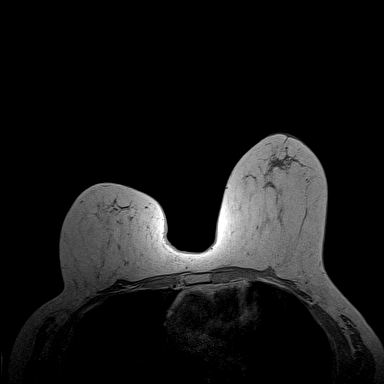
[im 108/144]
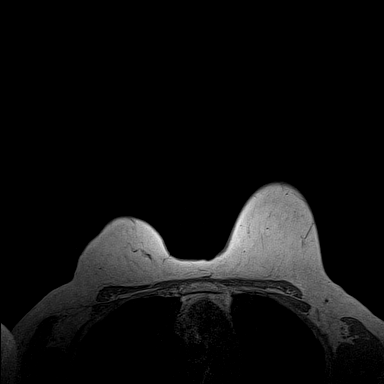
[im 144/144]
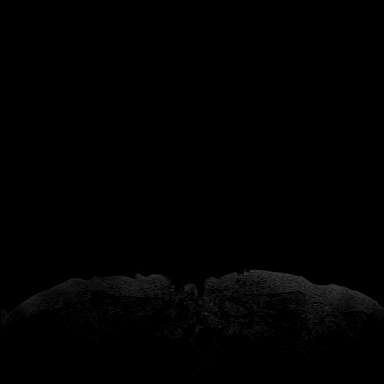

[Series 4: fl3d pre-cm · axial · non-contrast · 1.2mm · 0.89mm/px · z∈[-61,+111]mm · 5 of 144 slices shown]
[im 1/144]
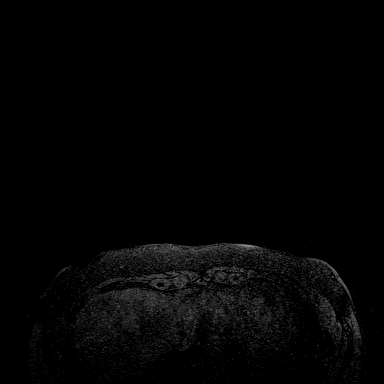
[im 36/144]
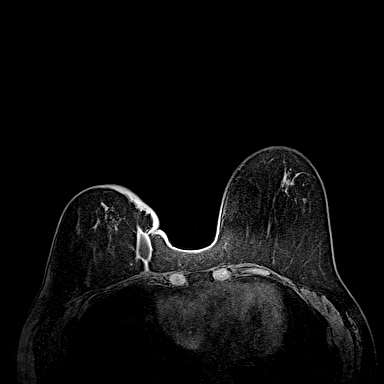
[im 72/144]
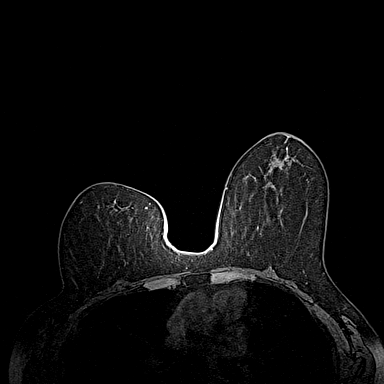
[im 108/144]
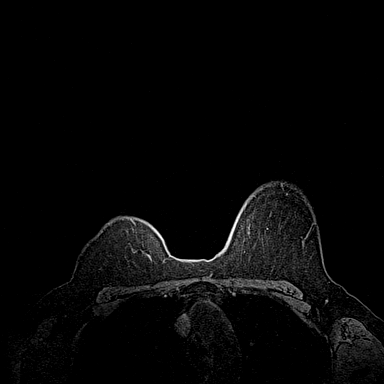
[im 144/144]
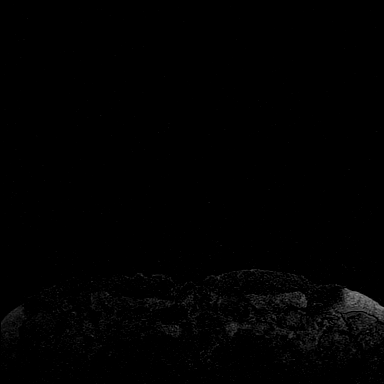

[Series 5: fl3d post-cm 20 · axial · 1.2mm · 0.89mm/px · z∈[-61,+111]mm · 5 of 144 slices shown (1 of 3)]
[im 1/144]
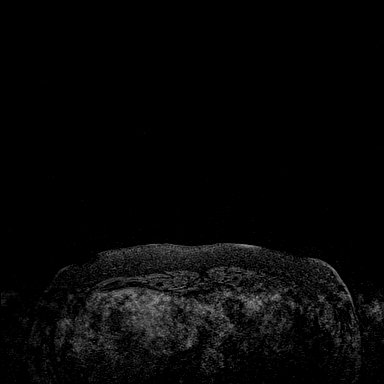
[im 36/144]
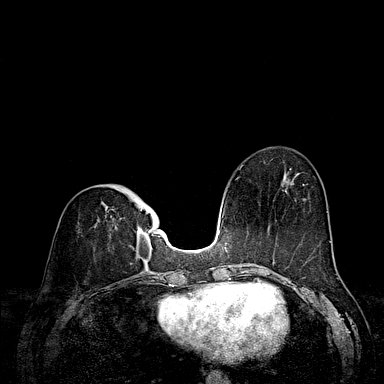
[im 72/144]
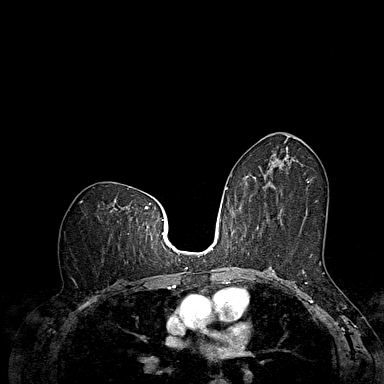
[im 108/144]
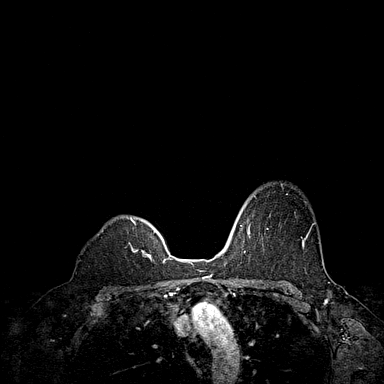
[im 144/144]
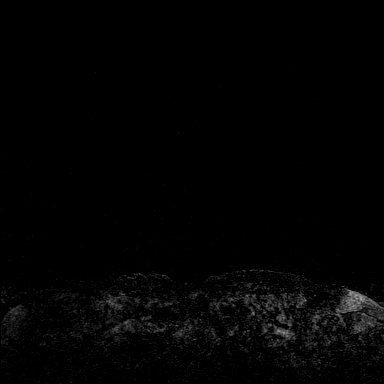

[Series 6: fl3d post-cm 20 · axial · 1.2mm · 0.89mm/px · z∈[-61,+111]mm · 5 of 144 slices shown (2 of 3)]
[im 1/144]
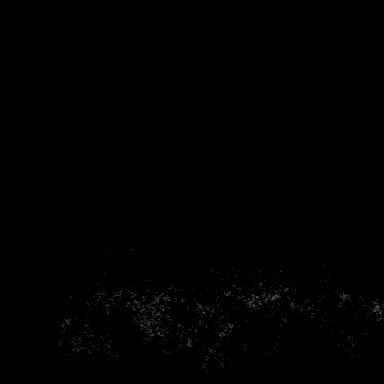
[im 36/144]
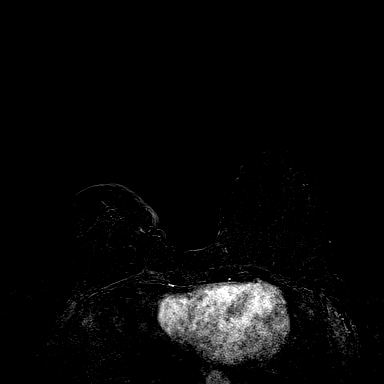
[im 72/144]
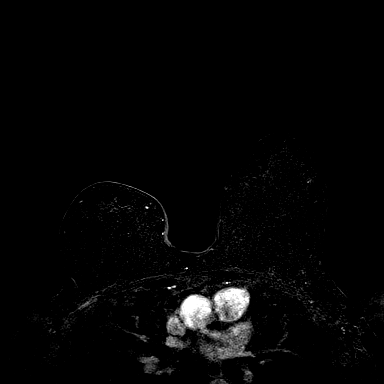
[im 108/144]
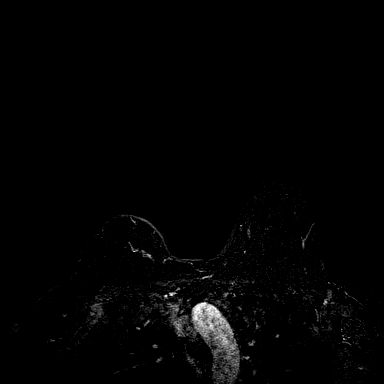
[im 144/144]
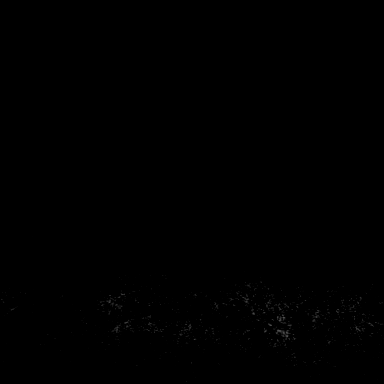

[Series 7: fl3d post-cm 20 · axial · 172.8mm · 0.89mm/px · 1 of 1 slices shown (3 of 3)]
[im 1/1]
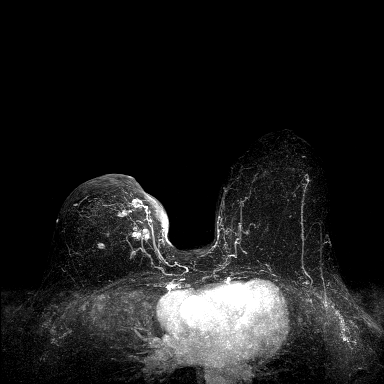

[Series 8: fl3d post-cm 3min · axial · 1.2mm · 0.89mm/px · z∈[-61,+111]mm · 5 of 144 slices shown]
[im 1/144]
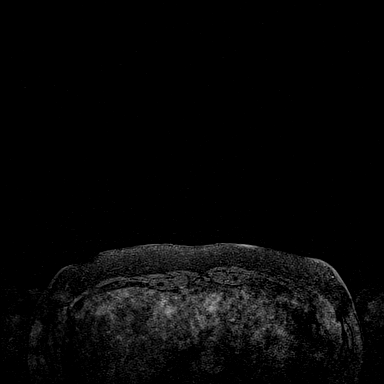
[im 36/144]
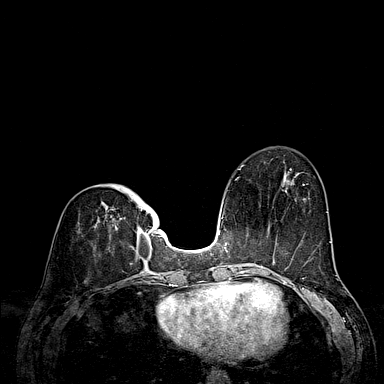
[im 72/144]
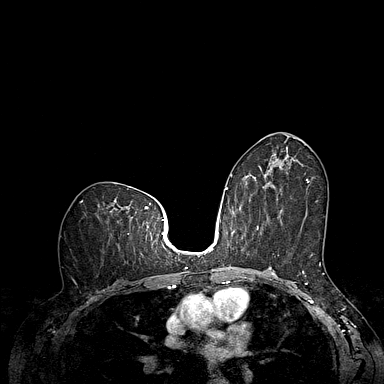
[im 108/144]
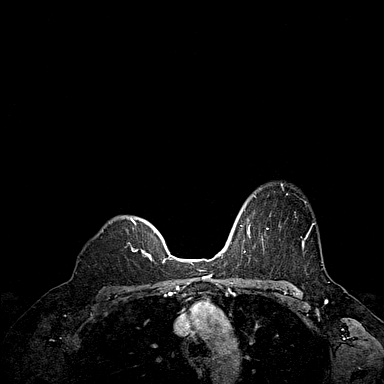
[im 144/144]
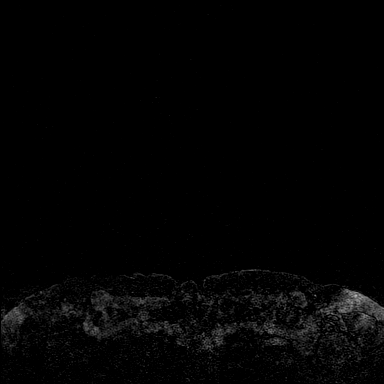

[Series 9: fl3d post-cm 3min_sub · axial · 1.2mm · 0.89mm/px · z∈[-61,+24]mm · 3 of 144 slices shown]
[im 1/144]
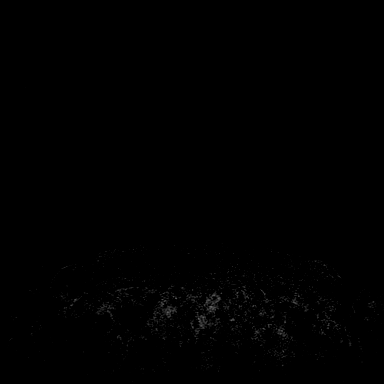
[im 36/144]
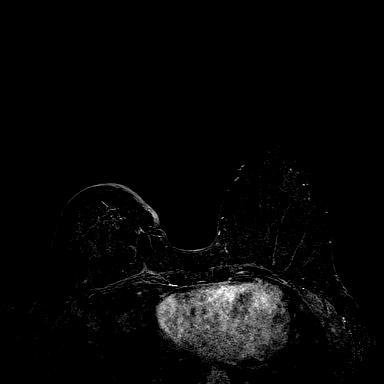
[im 72/144]
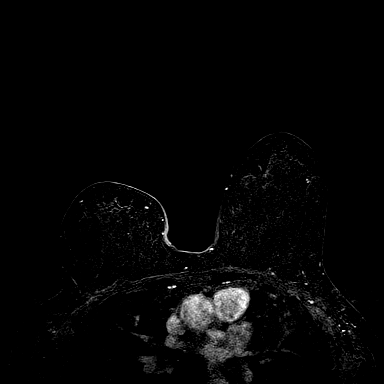

[30 of 48 positions shown; findings below may reference images not displayed]

Three-dimensional MR images were rendered by post-processing of the
original MR data on an independent workstation. The
three-dimensional MR images were interpreted, and findings are
reported in the following complete MRI report for this study. Three
dimensional images were evaluated at the independent interpreting
workstation using the DynaCAD thin client.
FINDINGS: Breast composition: b.  Scattered fibroglandular tissue.

Background parenchymal enhancement: Mild.

Right breast: Lumpectomy changes are identified in the medial right
breast with associated deformity, skin thickening and the presence
of a 2.5 cm seroma. Artifact from biopsy marking clips is present in
both the upper central and lateral right breast at sites of prior
benign biopsies. Two adjacent areas of irregular enhancement in the
upper central right breast anterior depth (images 55-59) contain
biopsy clip artifact compatible with areas of biopsy proven fat
necrosis ([DATE] and 1 o'clock).

There are irregular enhancing masses in the slightly upper inner
right breast (images 89 and 92) containing internal fat signal most
compatible with areas of fat necrosis. These areas are felt to
correspond with the mass seen in the right breast at the [DATE]
position.

There is a 1 cm enhancing mass in the upper inner right breast
(image 100) which is felt to have a tiny focus of central fat signal
(pre contrast T1 image 101). An additional 0.6 cm irregular
enhancing mass located slightly more central on image 93 is also
felt to have subtle internal fat signal.

There is an irregular enhancing 0.6 cm mass in the upper slightly
outer right breast (image 64) without definite central fat signal
intensity. This is felt to correspond with the new mass seen in the
right breast at 11 o'clock 6 cm from nipple on recent ultrasound
dated [DATE].

Left breast: No mass or abnormal enhancement.

Lymph nodes: No abnormal appearing lymph nodes.

Ancillary findings:  None.
IMPRESSION: 1. Multiple similar appearing enhancing masses within the right
breast, 3 of which have been previously biopsied with pathology
returning as fat necrosis.

2. The 0.6 cm mass in the upper slightly outer right breast without
definite central fat signal corresponds with the new mass seen in
the right breast at the 11 o'clock position 6 cm from nipple on
recent ultrasound dated [DATE]. While this is suspected to
represent an additional area of fat necrosis it cannot be clearly
characterized as such.

RECOMMENDATION:
Recommend ultrasound-guided biopsy of the mass in the right breast
at 11 o'clock 6 cm from nipple (seen on recent breast ultrasound
dated [DATE]). If pathology from this mass returns as benign/fat
necrosis, then recommend six-month follow-up breast MRI to ensure
stable appearance of the additional multiple probable areas of fat
necrosis in the right breast.

BI-RADS CATEGORY  4: Suspicious.

## 2020-12-20 MED ORDER — GADOBUTROL 1 MMOL/ML IV SOLN
6.0000 mL | Freq: Once | INTRAVENOUS | Status: AC | PRN
  Administered 2020-12-20: 6 mL via INTRAVENOUS

## 2020-12-25 ENCOUNTER — Other Ambulatory Visit: Payer: Self-pay | Admitting: Vascular Surgery

## 2020-12-25 DIAGNOSIS — N63 Unspecified lump in unspecified breast: Secondary | ICD-10-CM

## 2020-12-30 ENCOUNTER — Ambulatory Visit
Admission: RE | Admit: 2020-12-30 | Discharge: 2020-12-30 | Disposition: A | Discharge status: Home / Self Care | Payer: Medicare Other | Source: Ambulatory Visit | Attending: Vascular Surgery | Admitting: Vascular Surgery

## 2020-12-30 ENCOUNTER — Other Ambulatory Visit: Payer: Self-pay

## 2020-12-30 ENCOUNTER — Other Ambulatory Visit: Payer: Self-pay | Admitting: Vascular Surgery

## 2020-12-30 DIAGNOSIS — N63 Unspecified lump in unspecified breast: Secondary | ICD-10-CM

## 2020-12-30 DIAGNOSIS — N6311 Unspecified lump in the right breast, upper outer quadrant: Secondary | ICD-10-CM | POA: Diagnosis not present

## 2020-12-30 DIAGNOSIS — N641 Fat necrosis of breast: Secondary | ICD-10-CM | POA: Diagnosis not present

## 2020-12-30 IMAGING — US US  BREAST BX W/ LOC DEV 1ST LESION IMG BX SPEC US GUIDE*R*
1 series · 8 of 8 positions shown · non-contrast
Comparison: Previous exam(s).
COMPARISON: Previous exam(s).

Addendum:
CLINICAL DATA: Patient with indeterminate right breast mass 11
o'clock position.

EXAM:
ULTRASOUND GUIDED RIGHT BREAST CORE NEEDLE BIOPSY

[Series 1: us breast bx w/ loc dev 1st lesion img bx spec us  · 0.08mm/px · 8 of 8 slices shown]
[im 1/8]
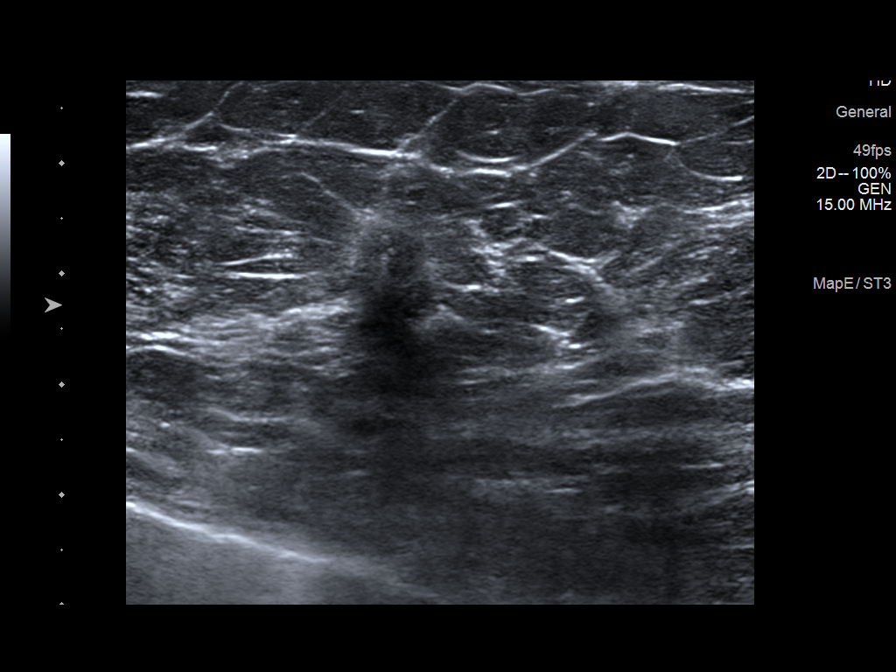
[im 2/8]
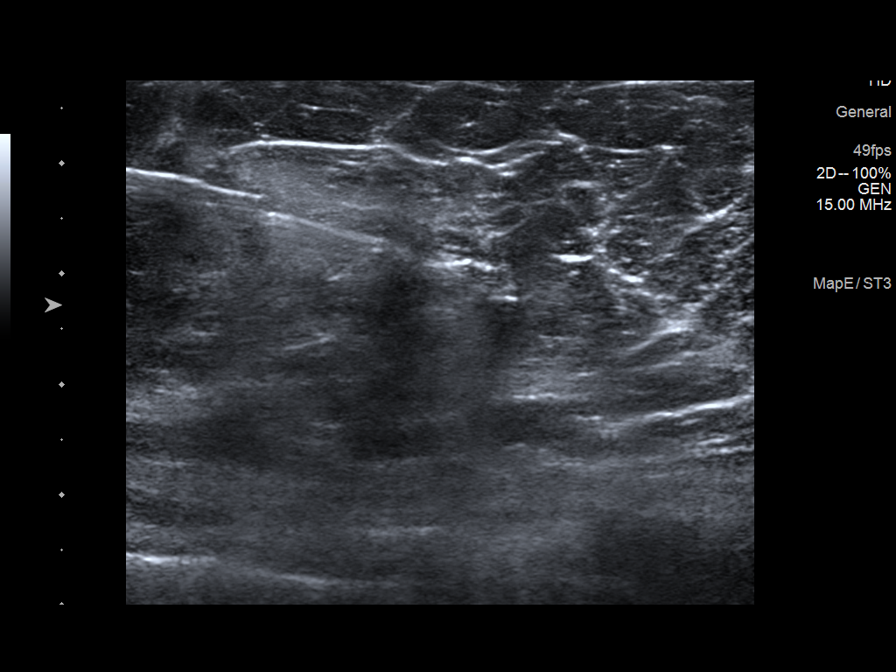
[im 3/8]
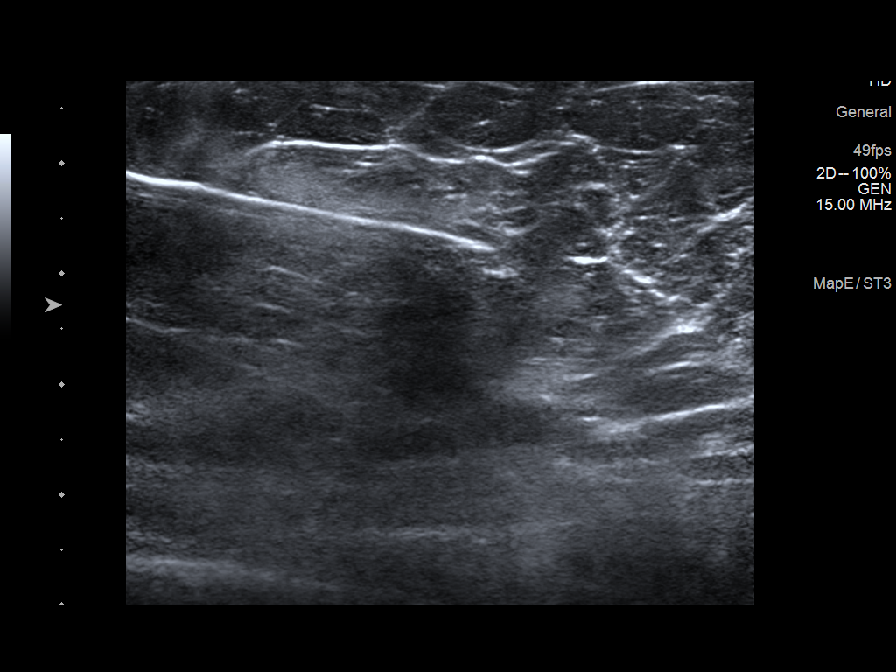
[im 4/8]
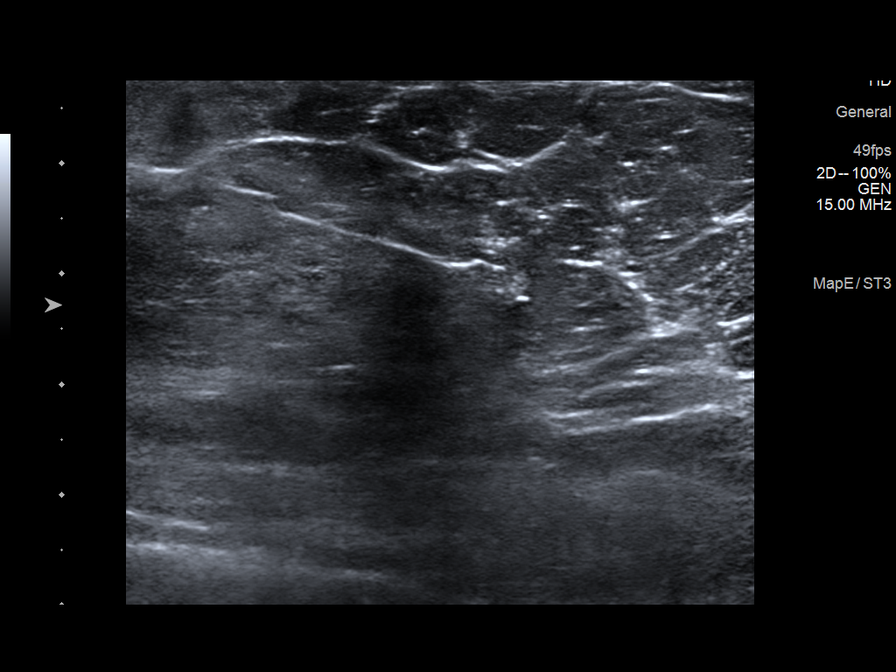
[im 5/8]
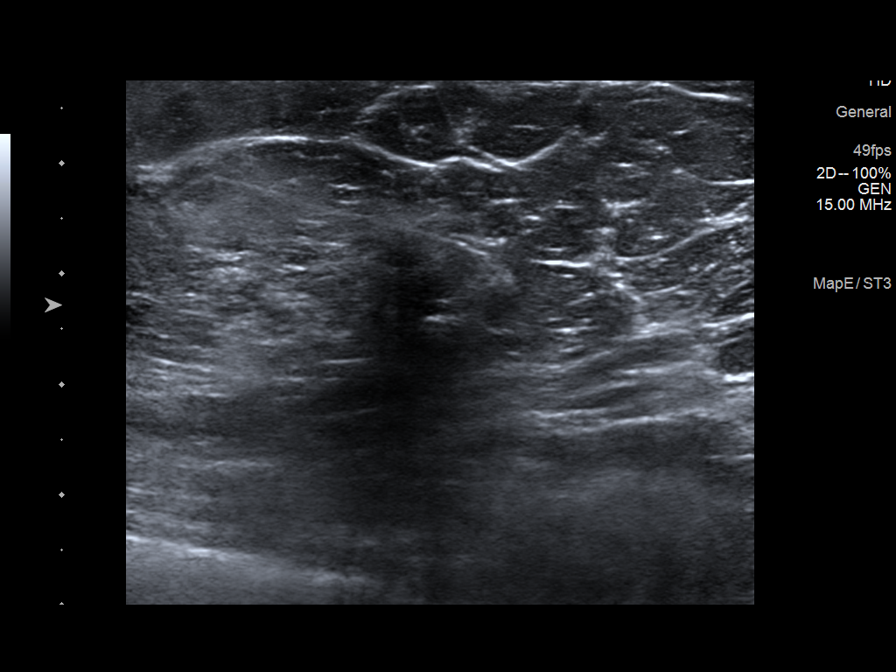
[im 6/8]
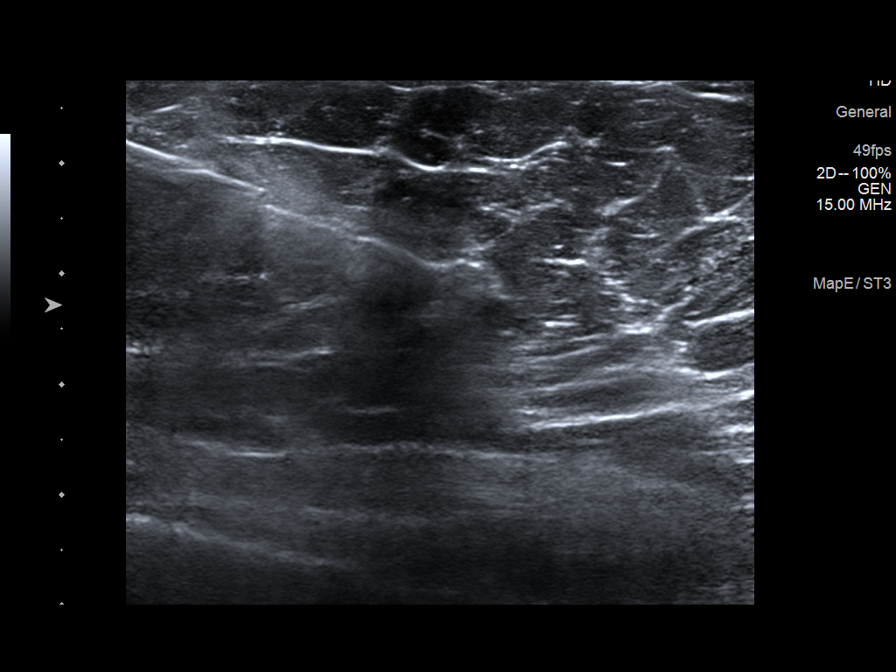
[im 7/8]
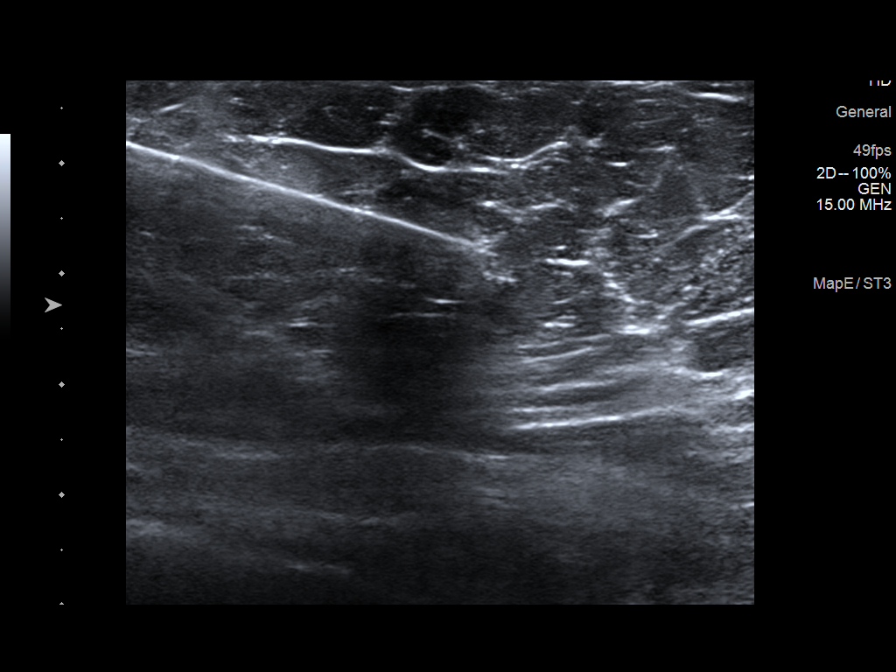
[im 8/8]
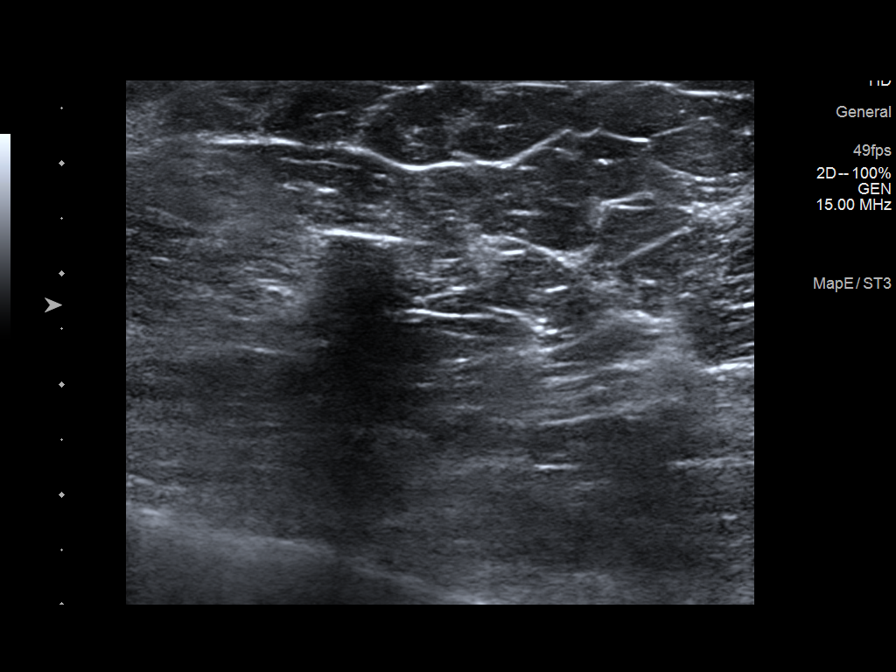

[8 of 8 positions shown; findings below may reference images not displayed]



Lesion quadrant: Upper outer quadrant

Using sterile technique and 1% Lidocaine as local anesthetic, under
direct ultrasound visualization, a 14 gauge KRUPP device was
used to perform biopsy of right breast mass 11 o'clock position
using a lateral approach. At the conclusion of the procedure tribell
tissue marker clip was deployed into the biopsy cavity. Follow up 2
view mammogram was performed and dictated separately.
IMPRESSION: Ultrasound guided biopsy of right breast mass 11 o'clock position.
No apparent complications.

ADDENDUM:
Pathology revealed FAT NECROSIS WITH CALCIFICATIONS of Right breast,
11 o'clock. This was found to be concordant by Dr. KRUPP.

Pathology results were discussed with the patient by telephone. The
patient reported doing well after the biopsy with tenderness at the
site. Post biopsy instructions and care were reviewed and questions
were answered. The patient was encouraged to call The [REDACTED]

The patient was instructed to return for a bilateral breast MRI in 6
months, per protocol. The patient was also instructed to return for
Right diagnostic mammography in 6 months, and informed the mammogram
could be performed at [REDACTED] in [HOSPITAL][HOSPITAL].

Pathology results reported by KRUPP, RN on [DATE].



Lesion quadrant: Upper outer quadrant

Using sterile technique and 1% Lidocaine as local anesthetic, under
direct ultrasound visualization, a 14 gauge KRUPP device was
used to perform biopsy of right breast mass 11 o'clock position
using a lateral approach. At the conclusion of the procedure tribell
tissue marker clip was deployed into the biopsy cavity. Follow up 2
view mammogram was performed and dictated separately.
IMPRESSION: Ultrasound guided biopsy of right breast mass 11 o'clock position.
No apparent complications.

## 2020-12-30 IMAGING — MG MM BREAST LOCALIZATION CLIP
4 series · 4 of 12 positions shown · non-contrast
Comparison: Previous exam(s).

CLINICAL DATA: Patient status post ultrasound-guided biopsy right
breast mass.

EXAM:
DIAGNOSTIC RIGHT MAMMOGRAM POST ULTRASOUND BIOPSY

[R CC synth-2D]
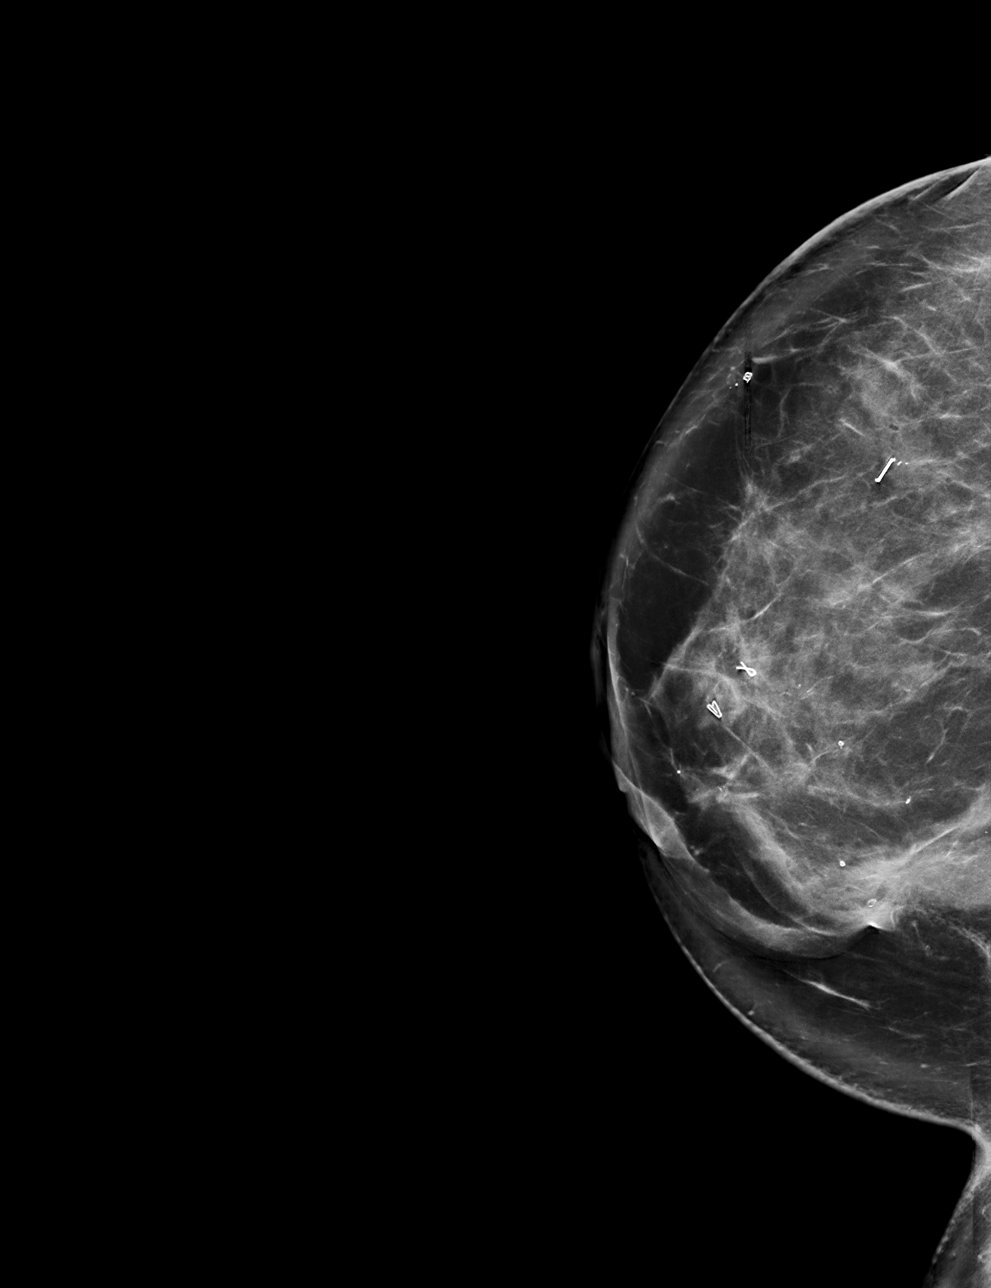

[R ML synth-2D]
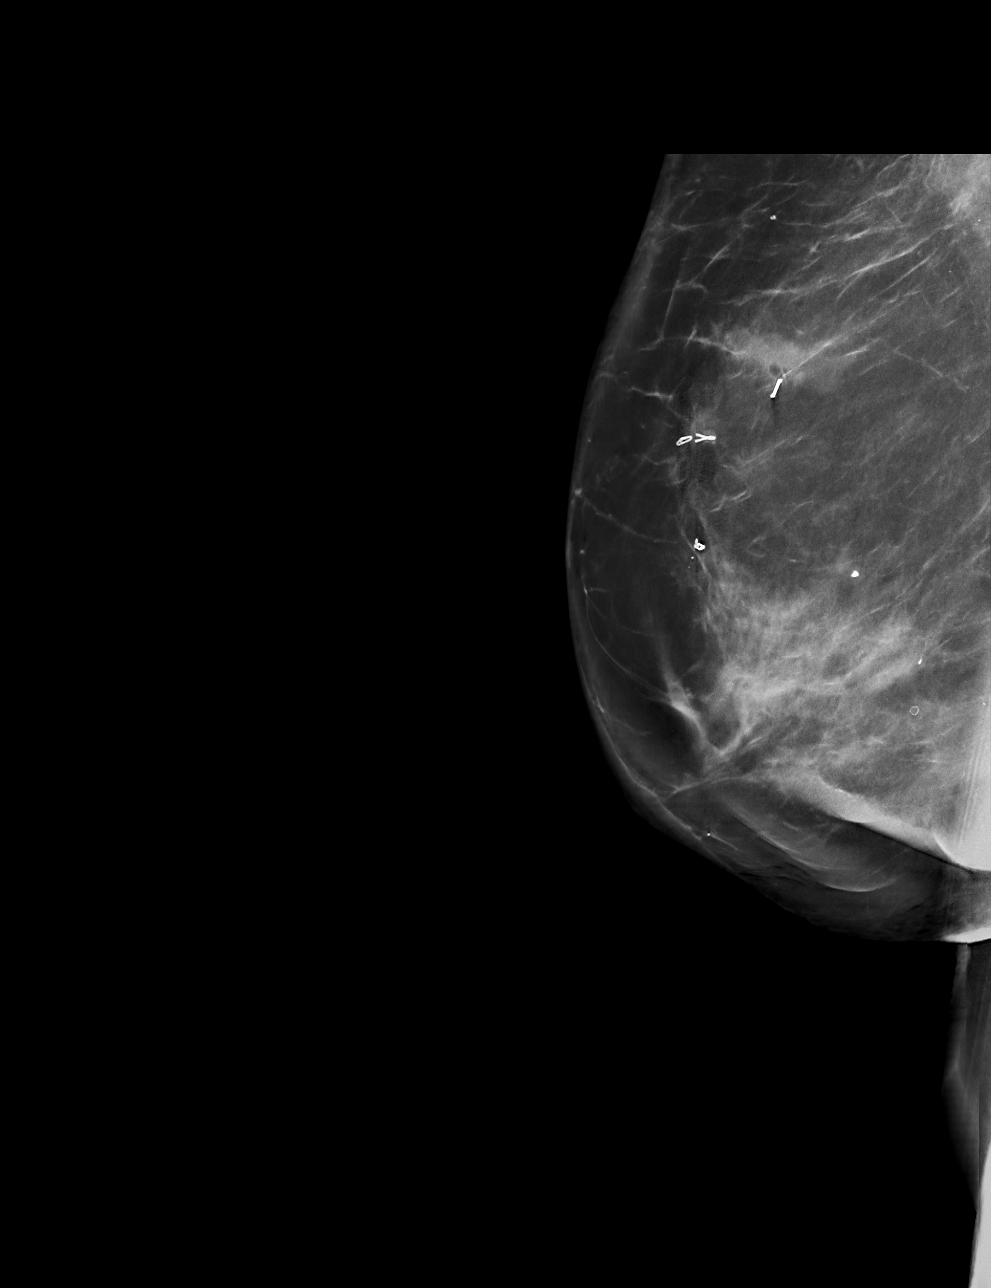

[R CC tomo · tomo slice 51/100.0]
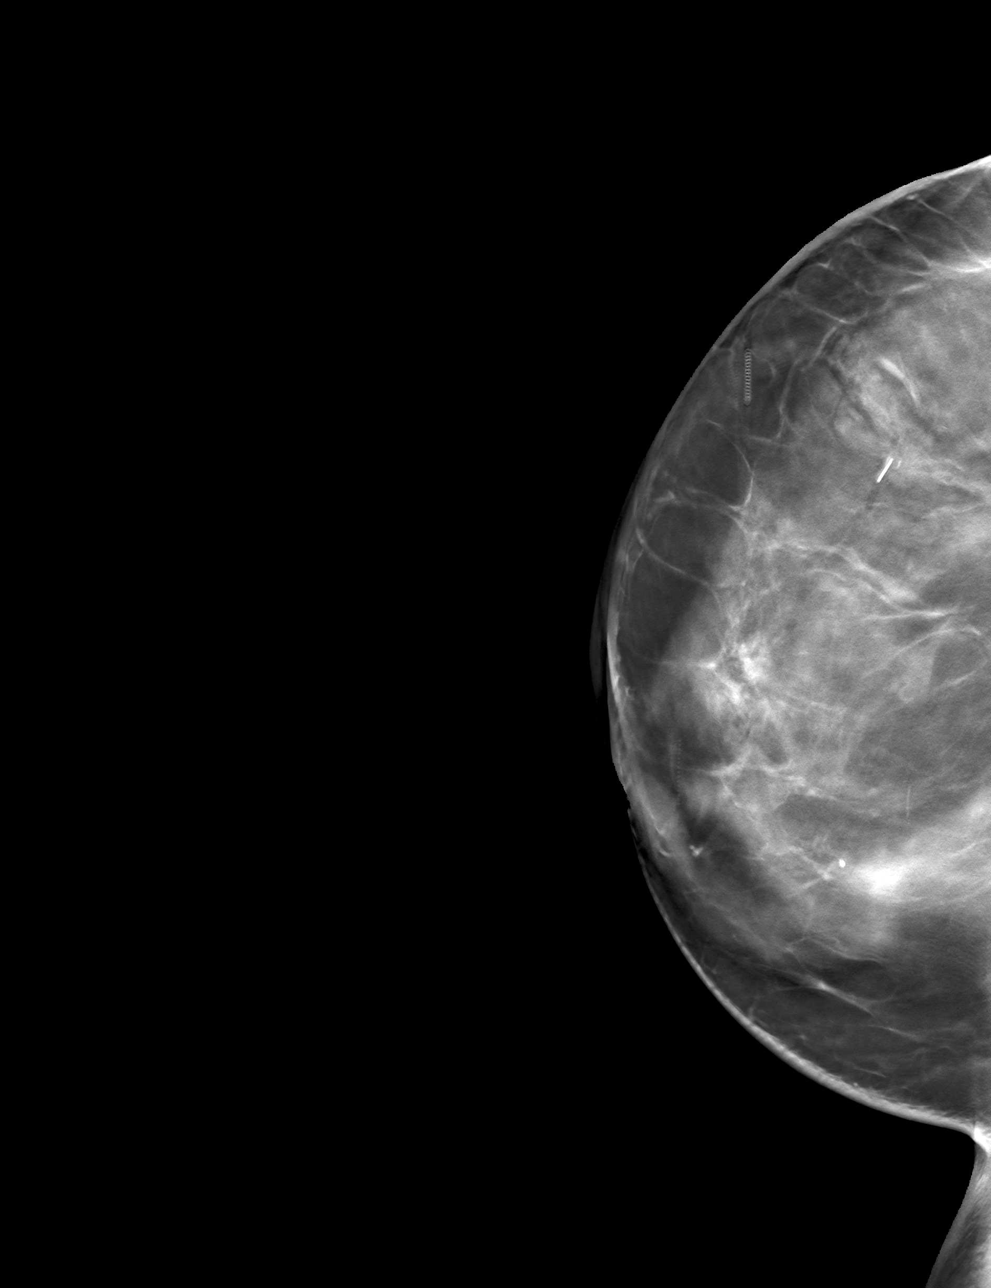

[R ML tomo · tomo slice 55/110.0]
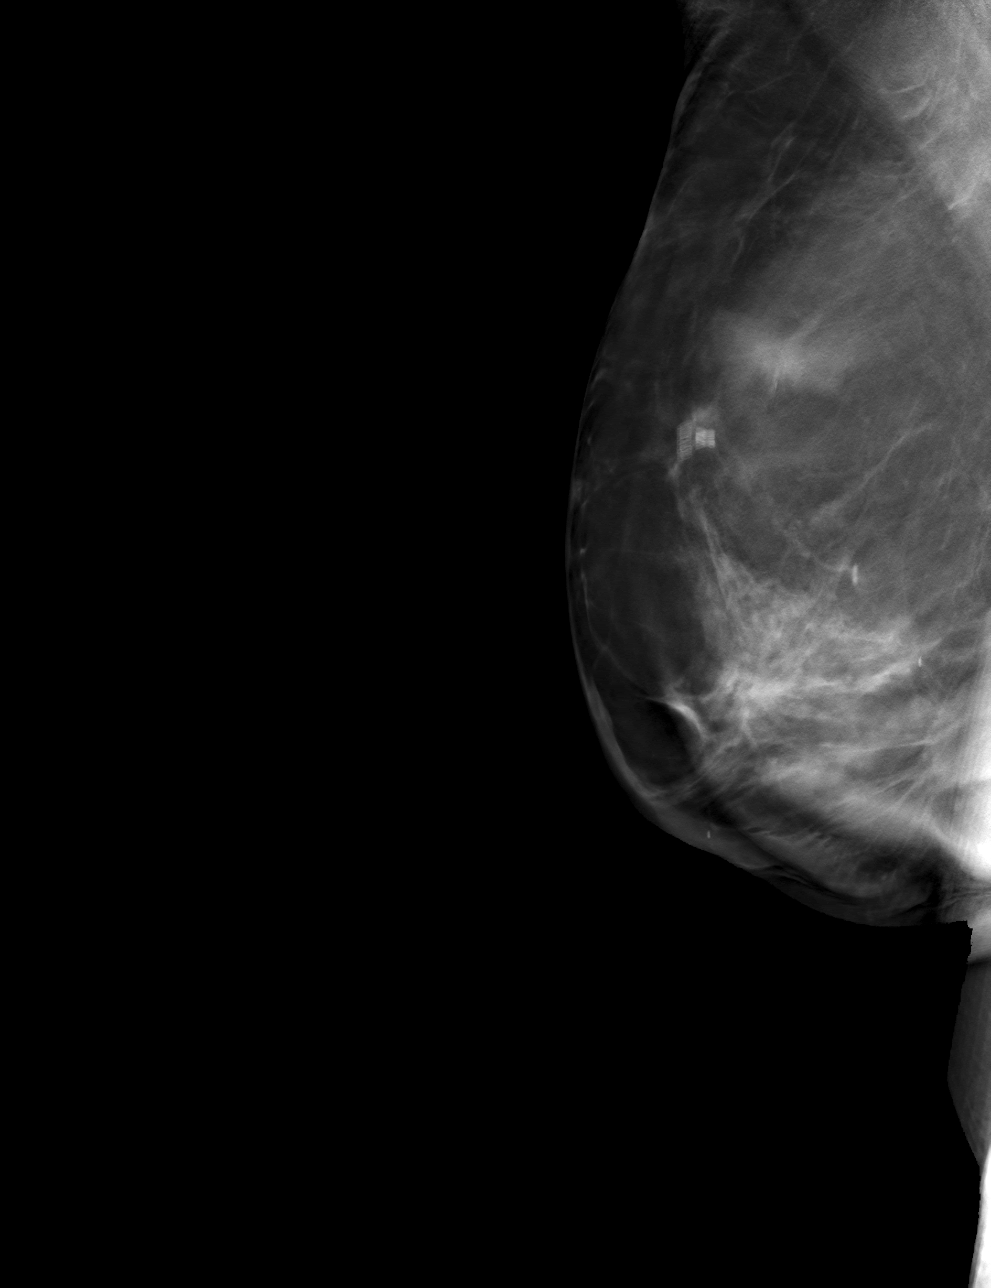

[4 of 12 positions shown; findings below may reference images not displayed]

FINDINGS: Mammographic images were obtained following ultrasound guided biopsy
of right breast mass 11 position. The biopsy marking clip is in
expected position at the site of biopsy. Additional previously
placed biopsy marking clips are redemonstrated.
IMPRESSION: Appropriate positioning of the tribell shaped biopsy marking clip at
the site of biopsy in the right breast mass 11 o'clock position.

Final Assessment: Post Procedure Mammograms for Marker Placement

## 2021-01-05 DIAGNOSIS — R928 Other abnormal and inconclusive findings on diagnostic imaging of breast: Secondary | ICD-10-CM | POA: Diagnosis not present

## 2021-01-05 DIAGNOSIS — C50311 Malignant neoplasm of lower-inner quadrant of right female breast: Secondary | ICD-10-CM | POA: Diagnosis not present

## 2021-01-05 DIAGNOSIS — N641 Fat necrosis of breast: Secondary | ICD-10-CM | POA: Insufficient documentation

## 2021-01-05 DIAGNOSIS — Z171 Estrogen receptor negative status [ER-]: Secondary | ICD-10-CM | POA: Diagnosis not present

## 2021-01-05 HISTORY — DX: Fat necrosis of breast: N64.1

## 2021-01-25 ENCOUNTER — Telehealth: Payer: Self-pay | Admitting: Hematology and Oncology

## 2021-01-25 NOTE — Telephone Encounter (Signed)
Patient called to verify Aug Appt's

## 2021-03-18 DIAGNOSIS — R233 Spontaneous ecchymoses: Secondary | ICD-10-CM | POA: Diagnosis not present

## 2021-03-18 DIAGNOSIS — L821 Other seborrheic keratosis: Secondary | ICD-10-CM | POA: Diagnosis not present

## 2021-04-06 ENCOUNTER — Telehealth: Payer: Self-pay | Admitting: Hematology and Oncology

## 2021-04-06 ENCOUNTER — Encounter: Payer: Self-pay | Admitting: Hematology and Oncology

## 2021-04-06 ENCOUNTER — Other Ambulatory Visit: Payer: Self-pay

## 2021-04-06 ENCOUNTER — Inpatient Hospital Stay: Payer: Medicare Other | Attending: Hematology and Oncology | Admitting: Hematology and Oncology

## 2021-04-06 ENCOUNTER — Inpatient Hospital Stay: Payer: Medicare Other

## 2021-04-06 VITALS — BP 128/89 | HR 88 | Temp 98.6°F | Resp 16 | Ht 64.0 in | Wt 122.8 lb

## 2021-04-06 DIAGNOSIS — C50311 Malignant neoplasm of lower-inner quadrant of right female breast: Secondary | ICD-10-CM | POA: Diagnosis not present

## 2021-04-06 DIAGNOSIS — Z171 Estrogen receptor negative status [ER-]: Secondary | ICD-10-CM

## 2021-04-06 NOTE — Telephone Encounter (Signed)
Per 8/23 LOS, called patient to schedule Feb 2023 Appt's.  Patient recorded on her Calendar

## 2021-04-06 NOTE — Progress Notes (Signed)
[facility]  [address] [place],  [place]  [address] [phone]  Clinic Day:  04/06/2021  Referring physician: Ernestene Kiel, MD   CHIEF COMPLAINT:  CC: A 75 year old female with history of Stage IA triple negative right breast cancer here for 6 month follow up.  Current Treatment:  Surveillance   HISTORY OF PRESENT ILLNESS:  Brianna Leonard is a 75 y.o. female with stage IA (T1b N0 M0) triple negative right breast cancer diagnosed in March 2018.  She was treated with lumpectomy.  Pathology revealed a 1 cm, grade 2, invasive ductal carcinoma.  One sentinel node was negative for metastasis.  Estrogen and progesterone receptors were negative.  HER 2 by IHC was equivocal, but HER 2 by FISH was negative.  Adjuvant chemotherapy was recommended with 4 cycles of docetaxel and cyclophosphamide beginning in May 2018.  Prior to her second cycle in June, she reported episodes of shortness of breath not necessarily associated with exertion, as well as intermittent chest tightness, which occasionally woke her at night, and a sensation of her heart racing.    EKG was normal and she was asymptomatic, so we advised her to see Dr. Laqueta Due if she had recurrent symptoms.  Unfortunately, she did have recurrent dyspnea and CT chest in June revealed a right lower lobe subsegmental pulmonary embolism.  Dr. Laqueta Due placed the patient on rivaroxaban 15 mg twice daily.  The patient then developed a diffuse rash with cephalexin, which she was on for mastitis.  She has had chronic pain in the right breast since surgery.  In August 2018, she reported shortness of breath and a heaviness in her chest again.  CTA chest at that time revealed resolution of the previously seen pulmonary embolism.  Rivaroxaban was discontinued and she was placed on aspirin to 81 mg daily.  She received adjuvant radiation to the right breast, completed in September 2018.  Her port was subsequently  removed.  When she was seen in March 2021, she was doing fairly well.  She had a bilateral diagnostic mammogram in April, which revealed a new area asymmetry with associated calcifications in the lateral right breast.  There were postoperative changes at the right lumpectomy site with an associated seroma, which were stable.  Ultrasound revealed a hypoechoic mass in the right lateral breast, measuring 8 mm in greatest dimension, at 9:30 o'clock 4 cm from the nipple.  Biopsy was recommended.  Pathology revealed breast and adipose tissue with fat necrosis and dense fibrosis.  No atypia, in situ or invasive malignancy was identified.  In July she reported a mass in the right breast near her surgical excision.  Diagnostic right mammogram from July revealed an indeterminate adjacent masses in the upper inner quadrant of the right breast at the 12:30 o'clock position and the 1 o'clock position.  Biopsies from July 28th confirmed fat necrosis with fibrosis and were benign.  She did contract COVID back in September.    INTERVAL HISTORY:  [name] is is here for routine follow up and has been doing fairly well since her last visit.  She continues to have occasional pain of the right breast.  Most recent mammogram reveals stable masses to right breast most consistent with previous biopsies of fat necrosis. She will have repeat imaging in 6 months. She denies fever, chills, nausea or vomiting. She denies shortness of breath, chest pain or cough. She denies issue with bowel or bladder. She is asking about supplements saffron spice and  selenium and their role in breast cancer health.  REVIEW OF SYSTEMS:  Review of Systems  Constitutional: Negative.  Negative for appetite change, chills, fatigue, fever and unexpected weight change.  HENT:  Negative.    Eyes: Negative.   Respiratory: Negative.  Negative for chest tightness, cough, hemoptysis, shortness of breath and wheezing.   Cardiovascular: Negative.  Negative for chest  pain, leg swelling and palpitations.  Gastrointestinal: Negative.  Negative for abdominal distention, abdominal pain, blood in stool, constipation, diarrhea, nausea and vomiting.  Endocrine: Negative.   Genitourinary: Negative.  Negative for difficulty urinating, dysuria, frequency and hematuria.   Musculoskeletal:  Negative for arthralgias, back pain, flank pain, gait problem and myalgias.       Occasional right breast pain  Skin: Negative.   Neurological: Negative.  Negative for dizziness, extremity weakness, gait problem, headaches, light-headedness, numbness, seizures and speech difficulty.  Hematological: Negative.   Psychiatric/Behavioral: Negative.  Negative for depression and sleep disturbance. The patient is not nervous/anxious.     VITALS:  Blood pressure 128/89, pulse 88, temperature 98.6 F (37 C), temperature source Oral, resp. rate 16, height '5\' 4"'$  (1.626 m), weight 122 lb 12.8 oz (55.7 kg), SpO2 97 %.  Wt Readings from Last 3 Encounters:  04/06/21 122 lb 12.8 oz (55.7 kg)  10/07/20 121 lb 8 oz (55.1 kg)    Body mass index is 21.08 kg/m.  Performance status (ECOG): 1 - Symptomatic but completely ambulatory  PHYSICAL EXAM:  Physical Exam Constitutional:      General: She is not in acute distress.    Appearance: Normal appearance. She is normal weight.  HENT:     Head: Normocephalic and atraumatic.  Eyes:     General: No scleral icterus.    Extraocular Movements: Extraocular movements intact.     Conjunctiva/sclera: Conjunctivae normal.     Pupils: Pupils are equal, round, and reactive to light.  Cardiovascular:     Rate and Rhythm: Normal rate and regular rhythm.     Pulses: Normal pulses.     Heart sounds: Normal heart sounds. No murmur heard.   No friction rub. No gallop.  Pulmonary:     Effort: Pulmonary effort is normal. No respiratory distress.     Breath sounds: Normal breath sounds.  Chest:     Comments: She has a well healed scar in the lower inner  quadrant of the right breast with an area of seroma lateral and superior to that.  This is tender to palpation.  She also has a firm area in the upper outer quadrant which is tender.  No masses in either breast.  She has some mild erythema of the center of the right breast, primarily at 3 o'clock, which is stable to improved Abdominal:     General: Bowel sounds are normal. There is no distension.     Palpations: Abdomen is soft. There is no mass.     Tenderness: There is no abdominal tenderness.  Musculoskeletal:        General: Normal range of motion.     Cervical back: Normal range of motion and neck supple.     Right lower leg: No edema.     Left lower leg: No edema.  Lymphadenopathy:     Cervical: No cervical adenopathy.  Skin:    General: Skin is warm and dry.  Neurological:     General: No focal deficit present.     Mental Status: She is alert and oriented to person, place, and time.  Mental status is at baseline.  Psychiatric:        Mood and Affect: Mood normal.        Behavior: Behavior normal.        Thought Content: Thought content normal.        Judgment: Judgment normal.    LABS:  No flowsheet data found. No flowsheet data found.  STUDIES:   Exam(s): [id number] US/US BREAST-R LIMITED INC AXILLA CLINICAL DATA:  75 year old female with history of right breast cancer in 2018 status post lumpectomy. Patient had a benign breast biopsy in April 2021 revealing fat necrosis as well as two additional benign breast biopsies in July 2021 demonstrating fat necrosis. Patient presents for annual exam. No new problems.  EXAM: DIGITAL DIAGNOSTIC BILATERAL MAMMOGRAM WITH TOMOSYNTHESIS AND CAD; ULTRASOUND RIGHT BREAST  TECHNIQUE: Bilateral digital diagnostic mammography and breast tomosynthesis was performed. The images were evaluated with computer-aided detection.; Targeted ultrasound examination of the right breast was performed.  COMPARISON:  Previous exam(s).  ACR Breast  Density Category b: There are scattered areas of fibroglandular density.  FINDINGS: Mammogram:  Right breast: Spot compression tomosynthesis views were performed in addition to standard views. A spot 2D magnification view of the lumpectomy site was also performed. In the upper outer right breast posterior depth there is an irregular mass with internal calcification (cc slice [id number]) measuring approximately 0.6 cm. In the medial right breast there is an irregular spiculated mass measuring approximately 0.6 disease exaggerated cc slice [id number] 3 and MLO slice [id number]).  Left breast: No suspicious mass, distortion, or microcalcifications are identified to suggest presence of malignancy.  Ultrasound:  Targeted ultrasound performed in the right breast at 230-3 o'clock demonstrating several irregular hypoechoic masses. A representative mass at 2:30 o'clock 4 cm from the nipple measures 1.0 x 0.7 x 0.2 cm. There are several smaller masses in the region of patient's scar at 3 o'clock 4 cm from the nipple which are not measured. At 10 o'clock 5 cm from the nipple there is a similar appearing irregular hypoechoic mass. At 11 o'clock 6 cm from the nipple there is an irregular hypoechoic mass measuring 0.5 x 0.4 x 0.5 cm, which likely corresponds to the mass identified mammographically in the upper outer quadrant.  IMPRESSION: 1. There are multiple similar appearing irregular masses in the right breast, two of which in the upper outer and medial right breast appear new/increased in size from prior and have not been previously biopsied. These masses demonstrate similar appearance to the previously biopsied masses demonstrating fat necrosis.  2.  No mammographic evidence of malignancy in the left breast.  RECOMMENDATION: Diagnostic breast MRI for further evaluation of the patient's multiple right breast masses, which are favored to represent additional areas of fat necrosis.  I have  discussed the findings and recommendations with the patient. If applicable, a reminder letter will be sent to the patient regarding the next appointment.  BI-RADS CATEGORY  3: Probably benign.   Electronically Signed   By: Audie Pinto M.D.   On: 12/02/2020 10:54  Electronically Signed By: Tracey Harries MD  Electronically Signed Date/Time: 04/20/221057 Dictate Date/Time: 12/02/20 1038   Allergies:  Allergies  Allergen Reactions   Rivaroxaban Rash   Sulfamethoxazole Rash    Current Medications: Current Outpatient Medications  Medication Sig Dispense Refill   Cholecalciferol (VITAMIN D3) 50 MCG (2000 UT) TABS Take by mouth.     Multiple Vitamins-Minerals (HAIR/SKIN/NAILS/BIOTIN) TABS Take by mouth.     Zinc 25 MG TABS Take  by mouth.     amLODipine (NORVASC) 5 MG tablet Take 5 mg by mouth daily.     ascorbic acid (VITAMIN C) 500 MG tablet Take by mouth.     aspirin 81 MG EC tablet Take by mouth.     Biotin 5000 MCG TABS Take by mouth.     Omega-3 1000 MG CAPS Take by mouth.     OVER THE COUNTER MEDICATION Vivisal Hair Vitamin instead of Collagen     rosuvastatin (CRESTOR) 5 MG tablet Take by mouth.     THYROID PO Take by mouth. Thyroid Support Herbal Supplement     Turmeric 500 MG TABS Take 1,000 mg by mouth.     No current facility-administered medications for this visit.     ASSESSMENT & PLAN:   Assessment:   1.  Stage IA triple negative right breast cancer, diagnosed in March 2018.  She remains without evidence of recurrence. Repeat mammogram is scheduled for 6 months.  2.  Biopsies of the right breast from March 2021 and July were consistent with fat necrosis at 1 o'clock and 12:30 o'clock. We will continue to monitor.   Plan: We will see her back in 6 months for repeat examination.  She will be due for mammography in November, which is scheduled through Dr. Noberto Retort. I will review the supplements in question with Dr. Hinton Rao and call patient with  recommendations.    She verbalizes understanding of and agreement to the plans discussed today. She knows to call the office should any new questions or concerns arise.     Melodye Ped, NP [facility] AT [facility] [address] [place] [address] Dept: [phone] Dept Fax: [phone]

## 2021-05-07 ENCOUNTER — Other Ambulatory Visit: Payer: Self-pay | Admitting: Vascular Surgery

## 2021-05-07 DIAGNOSIS — C50311 Malignant neoplasm of lower-inner quadrant of right female breast: Secondary | ICD-10-CM

## 2021-05-21 DIAGNOSIS — Z23 Encounter for immunization: Secondary | ICD-10-CM | POA: Diagnosis not present

## 2021-05-24 ENCOUNTER — Ambulatory Visit
Admission: RE | Admit: 2021-05-24 | Discharge: 2021-05-24 | Disposition: A | Discharge status: Home / Self Care | Payer: Medicare Other | Source: Ambulatory Visit | Attending: Vascular Surgery | Admitting: Vascular Surgery

## 2021-05-24 ENCOUNTER — Other Ambulatory Visit: Payer: Self-pay

## 2021-05-24 DIAGNOSIS — N6320 Unspecified lump in the left breast, unspecified quadrant: Secondary | ICD-10-CM | POA: Diagnosis not present

## 2021-05-24 DIAGNOSIS — C50311 Malignant neoplasm of lower-inner quadrant of right female breast: Secondary | ICD-10-CM

## 2021-05-24 IMAGING — MR MR BREAST BILAT WO/W CM
8 of 12 series · 31 of 48 positions shown · IV contrast (6 ML GADAVIST)
Comparison: Previous exam(s).

CLINICAL DATA: Patient with history of right breast lumpectomy
[M8]. Patient subsequently has had biopsy of multiple additional
masses within the right breast which have demonstrated fat necrosis.
This serves as a follow-up to the recent MRI [DATE]. Note
patient had an additional biopsy of a right breast mass [DATE]
which demonstrated fat necrosis.

EXAM:
BILATERAL BREAST MRI WITH AND WITHOUT CONTRAST
TECHNIQUE: Multiplanar, multisequence MR images of both breasts were obtained
prior to and following the intravenous administration of 6 ml of
Gadavist

[Series 2: t2_tirm_tra ipat (a-p) · axial · 3.0mm · 0.70mm/px · 1 of 55 slices shown]
[im 1/55]
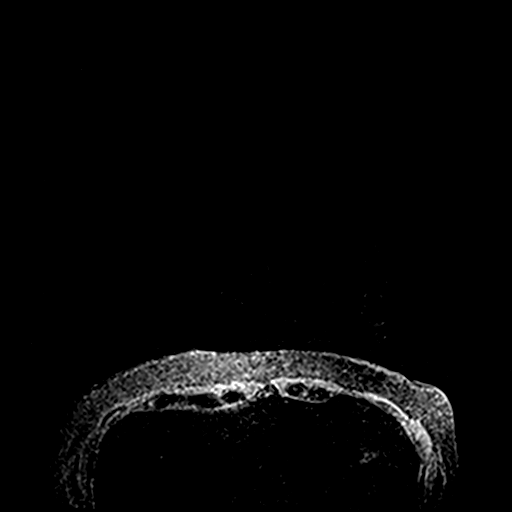

[Series 3: fl3d pre-cm no · axial · non-contrast · 1.2mm · 0.94mm/px · z∈[-69,+102]mm · 5 of 144 slices shown]
[im 1/144]
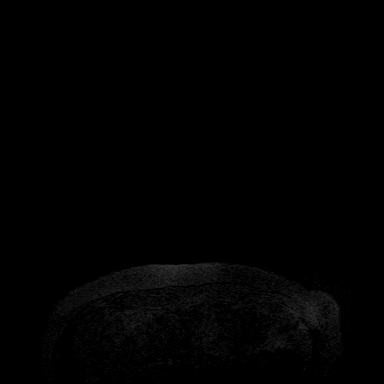
[im 36/144]
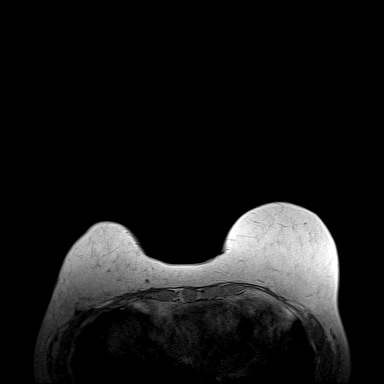
[im 72/144]
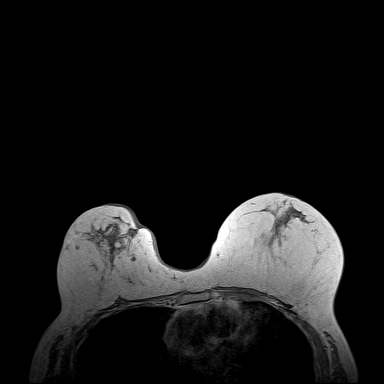
[im 108/144]
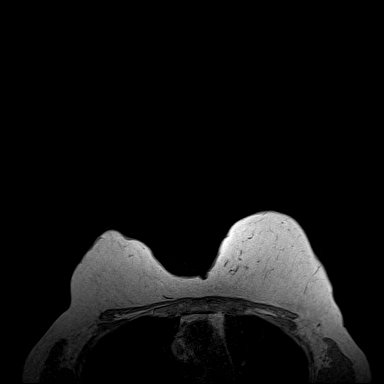
[im 144/144]
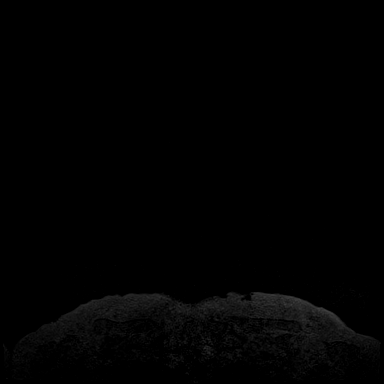

[Series 4: fl3d pre-cm · axial · non-contrast · 1.2mm · 0.94mm/px · z∈[-69,+102]mm · 5 of 144 slices shown]
[im 1/144]
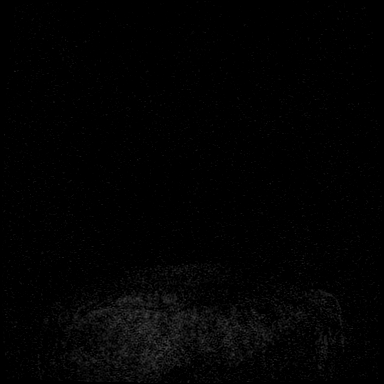
[im 36/144]
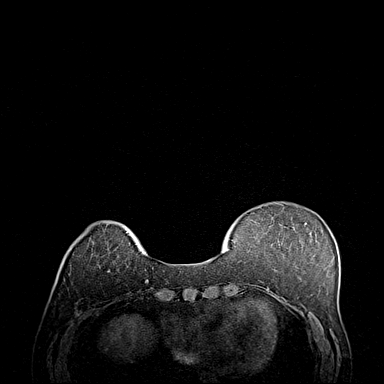
[im 72/144]
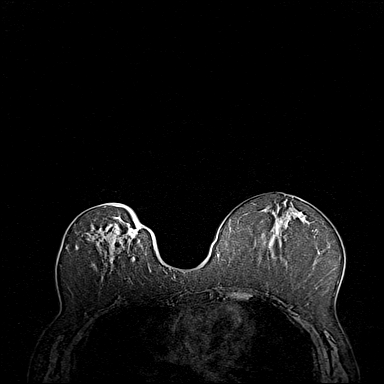
[im 108/144]
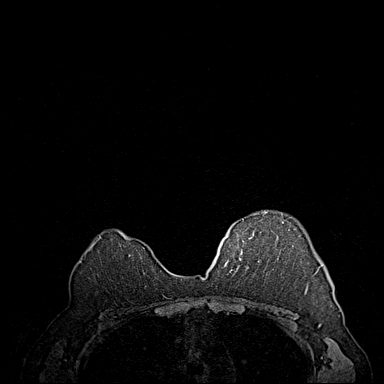
[im 144/144]
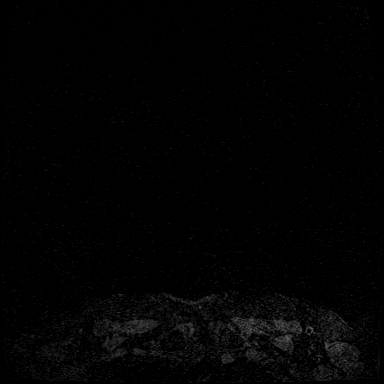

[Series 5: fl3d post immediate · axial · 1.2mm · 0.94mm/px · z∈[-69,+102]mm · 5 of 144 slices shown (1 of 3)]
[im 1/144]
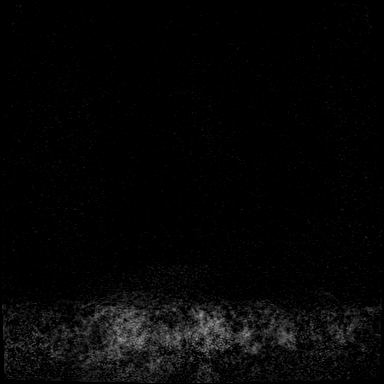
[im 36/144]
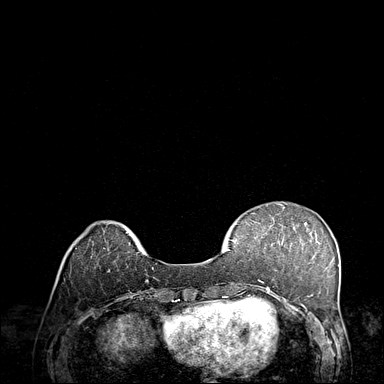
[im 72/144]
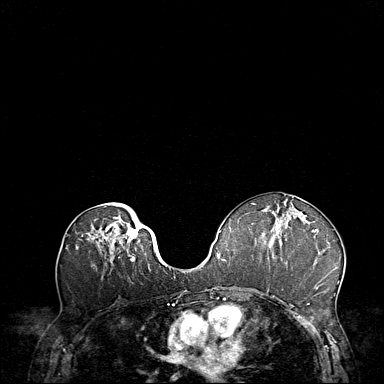
[im 108/144]
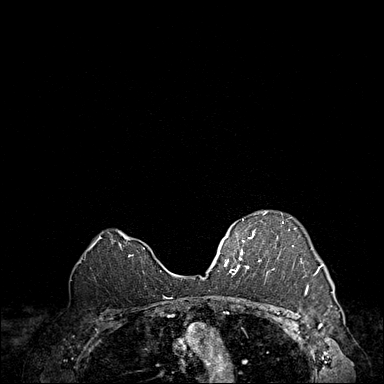
[im 144/144]
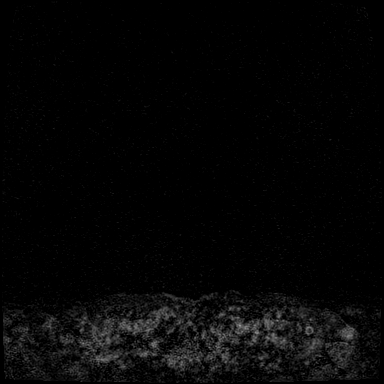

[Series 6: fl3d post immediate · axial · 1.2mm · 0.94mm/px · z∈[-69,+102]mm · 5 of 144 slices shown (2 of 3)]
[im 1/144]
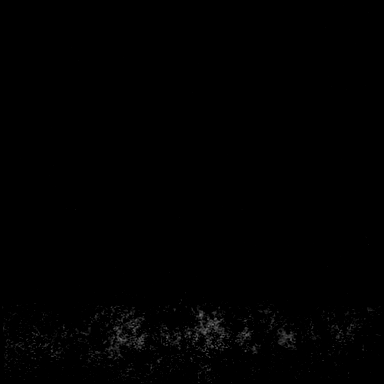
[im 36/144]
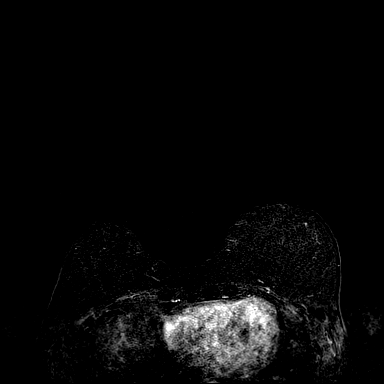
[im 72/144]
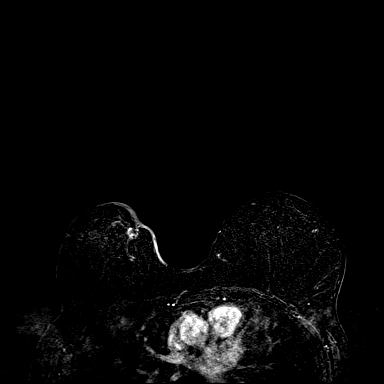
[im 108/144]
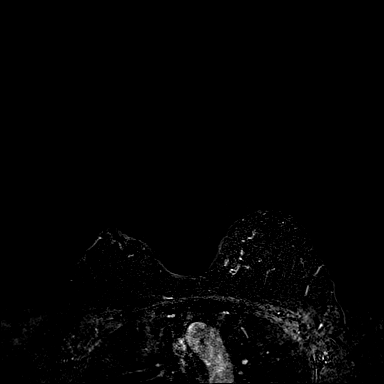
[im 144/144]
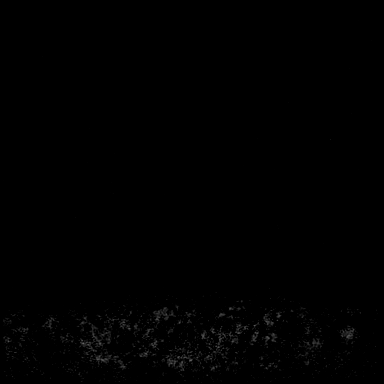

[Series 7: fl3d post immediate · axial · 172.8mm · 0.94mm/px · 1 of 1 slices shown (3 of 3)]
[im 1/1]
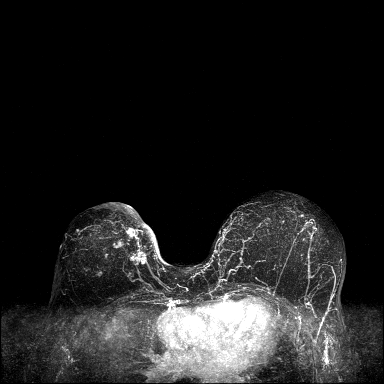

[Series 8: fl3d post 3min · axial · 1.2mm · 0.94mm/px · z∈[-69,+102]mm · 6 of 144 slices shown]
[im 1/144]
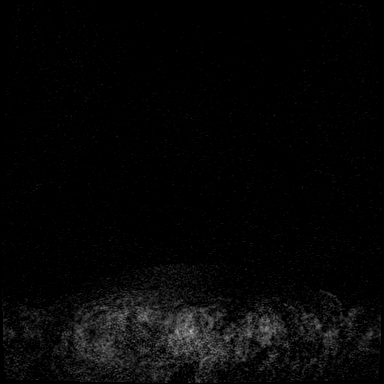
[im 29/144]
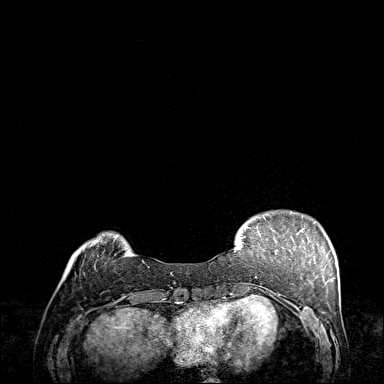
[im 58/144]
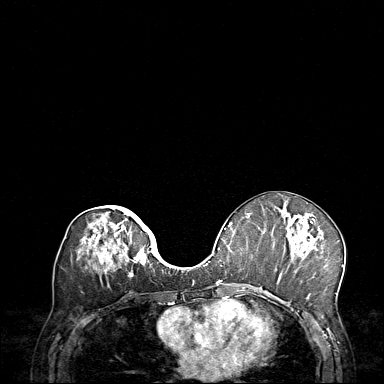
[im 86/144]
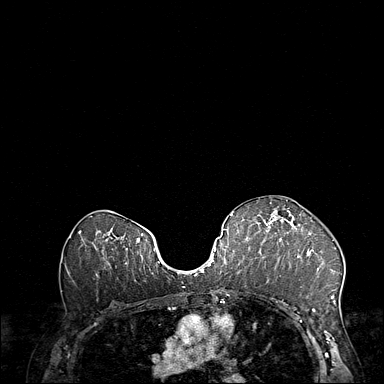
[im 115/144]
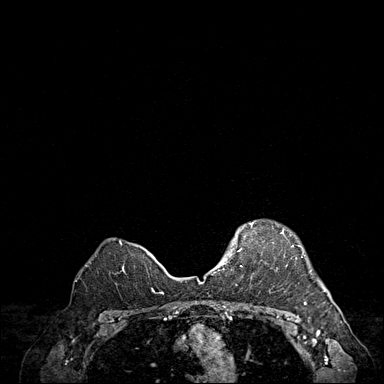
[im 144/144]
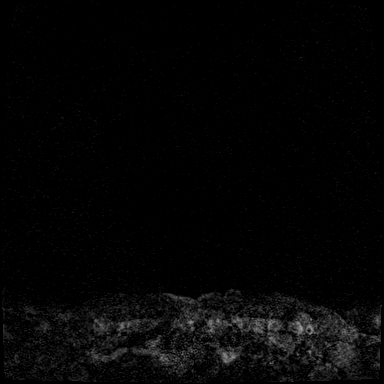

[Series 9: fl3d post 3min_sub · axial · 1.2mm · 0.94mm/px · z∈[-69,-1]mm · 3 of 144 slices shown]
[im 1/144]
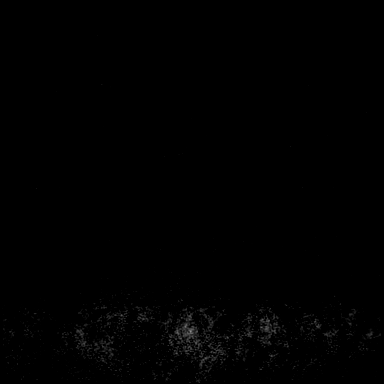
[im 29/144]
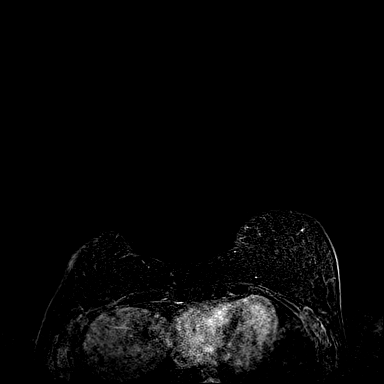
[im 58/144]
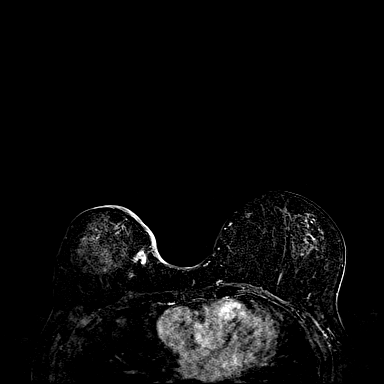

[31 of 48 positions shown; findings below may reference images not displayed]

Three-dimensional MR images were rendered by post-processing of the
original MR data on an independent workstation. The
three-dimensional MR images were interpreted, and findings are
reported in the following complete MRI report for this study. Three
dimensional images were evaluated at the independent interpreting
workstation using the DynaCAD thin client.
FINDINGS: Breast composition: a. Almost entirely fat.

Background parenchymal enhancement: Mild

Right breast: Post lumpectomy changes are identified within the
lower inner quadrant of the right breast, grossly unchanged when
compared to prior exam. Within the superior central and upper outer
right breast there is artifact from biopsy marking clips at the site
of prior benign biopsies demonstrating fat necrosis.

There are multiple adjacent irregular enhancing masses within the
medial aspect of the left breast, many of which demonstrate small
foci fat internally, suggestive of additional areas of fat necrosis.
Overall these are grossly similar in size when compared to prior
exam. Reference 9 mm lesion medial right breast (image 75; series
6), 11 mm enhancing lesion medial right breast (image 84; series 6)
and 8 mm enhancing lesion (image 80; series 6).

Left breast: No mass or abnormal enhancement.

Lymph nodes: No abnormal appearing lymph nodes.

Ancillary findings:  None.
IMPRESSION: There are multiple similar-appearing enhancing masses within the
right breast, the previously biopsied masses demonstrate fat
necrosis. The non biopsied masses are grossly similar in size and
appearance when compared to prior exam, many demonstrating small
focus of fat on T1 imaging, suggestive of additional areas of fat
necrosis. While these areas are favored to represent additional
areas of fat necrosis, given the multiplicity of the findings,
additional pathologies cannot be entirely excluded.

RECOMMENDATION:
Given the multiplicity of the findings, and many prior biopsies
demonstrating fat necrosis within the right breast as well as the
suggestion of small foci of fat within these additional lesions on
T1 weighted imaging, additional short-term follow-up bilateral
breast MRI is recommended in 6 months to ensure stability.

Additionally, patient is due for right breast diagnostic mammography
[DATE], to reassess the right breast masses.

BI-RADS CATEGORY  3: Probably benign.

## 2021-05-24 MED ORDER — GADOBUTROL 1 MMOL/ML IV SOLN
6.0000 mL | Freq: Once | INTRAVENOUS | Status: AC | PRN
  Administered 2021-05-24: 6 mL via INTRAVENOUS

## 2021-06-16 DIAGNOSIS — L814 Other melanin hyperpigmentation: Secondary | ICD-10-CM | POA: Diagnosis not present

## 2021-06-16 DIAGNOSIS — L821 Other seborrheic keratosis: Secondary | ICD-10-CM | POA: Diagnosis not present

## 2021-06-16 DIAGNOSIS — D2239 Melanocytic nevi of other parts of face: Secondary | ICD-10-CM | POA: Diagnosis not present

## 2021-06-16 DIAGNOSIS — D225 Melanocytic nevi of trunk: Secondary | ICD-10-CM | POA: Diagnosis not present

## 2021-06-16 DIAGNOSIS — L820 Inflamed seborrheic keratosis: Secondary | ICD-10-CM | POA: Diagnosis not present

## 2021-06-22 DIAGNOSIS — C50311 Malignant neoplasm of lower-inner quadrant of right female breast: Secondary | ICD-10-CM | POA: Diagnosis not present

## 2021-06-22 DIAGNOSIS — R928 Other abnormal and inconclusive findings on diagnostic imaging of breast: Secondary | ICD-10-CM | POA: Diagnosis not present

## 2021-06-22 DIAGNOSIS — Z171 Estrogen receptor negative status [ER-]: Secondary | ICD-10-CM | POA: Diagnosis not present

## 2021-06-22 DIAGNOSIS — N641 Fat necrosis of breast: Secondary | ICD-10-CM | POA: Diagnosis not present

## 2021-08-10 DIAGNOSIS — F419 Anxiety disorder, unspecified: Secondary | ICD-10-CM | POA: Diagnosis not present

## 2021-08-10 DIAGNOSIS — Z6821 Body mass index (BMI) 21.0-21.9, adult: Secondary | ICD-10-CM | POA: Diagnosis not present

## 2021-08-10 DIAGNOSIS — Z1331 Encounter for screening for depression: Secondary | ICD-10-CM | POA: Diagnosis not present

## 2021-08-10 DIAGNOSIS — I10 Essential (primary) hypertension: Secondary | ICD-10-CM | POA: Diagnosis not present

## 2021-08-10 DIAGNOSIS — R252 Cramp and spasm: Secondary | ICD-10-CM | POA: Diagnosis not present

## 2021-08-10 DIAGNOSIS — E785 Hyperlipidemia, unspecified: Secondary | ICD-10-CM | POA: Diagnosis not present

## 2021-08-10 DIAGNOSIS — Z1339 Encounter for screening examination for other mental health and behavioral disorders: Secondary | ICD-10-CM | POA: Diagnosis not present

## 2021-08-10 DIAGNOSIS — Z79899 Other long term (current) drug therapy: Secondary | ICD-10-CM | POA: Diagnosis not present

## 2021-08-10 DIAGNOSIS — R7303 Prediabetes: Secondary | ICD-10-CM | POA: Diagnosis not present

## 2021-08-10 DIAGNOSIS — E2839 Other primary ovarian failure: Secondary | ICD-10-CM | POA: Diagnosis not present

## 2021-08-10 DIAGNOSIS — Z0001 Encounter for general adult medical examination with abnormal findings: Secondary | ICD-10-CM | POA: Diagnosis not present

## 2021-08-14 DIAGNOSIS — R059 Cough, unspecified: Secondary | ICD-10-CM | POA: Diagnosis not present

## 2021-08-14 DIAGNOSIS — J069 Acute upper respiratory infection, unspecified: Secondary | ICD-10-CM | POA: Diagnosis not present

## 2021-08-14 DIAGNOSIS — Z20828 Contact with and (suspected) exposure to other viral communicable diseases: Secondary | ICD-10-CM | POA: Diagnosis not present

## 2021-09-02 DIAGNOSIS — M8580 Other specified disorders of bone density and structure, unspecified site: Secondary | ICD-10-CM | POA: Diagnosis not present

## 2021-09-02 DIAGNOSIS — M85852 Other specified disorders of bone density and structure, left thigh: Secondary | ICD-10-CM | POA: Diagnosis not present

## 2021-09-02 DIAGNOSIS — E2839 Other primary ovarian failure: Secondary | ICD-10-CM | POA: Diagnosis not present

## 2021-09-23 DIAGNOSIS — C50311 Malignant neoplasm of lower-inner quadrant of right female breast: Secondary | ICD-10-CM | POA: Diagnosis not present

## 2021-09-23 DIAGNOSIS — R922 Inconclusive mammogram: Secondary | ICD-10-CM | POA: Diagnosis not present

## 2021-09-23 DIAGNOSIS — Z853 Personal history of malignant neoplasm of breast: Secondary | ICD-10-CM | POA: Diagnosis not present

## 2021-09-30 NOTE — Progress Notes (Signed)
[facility]  [address] [place],  [place]  [address] [phone]  Clinic Day:  10/07/2021  Referring physician: Ernestene Kiel, MD  This document serves as a record of services personally performed by Hosie Poisson, MD. It was created on their behalf by Curry,Lauren E, a trained medical scribe. The creation of this record is based on the scribe's personal observations and the provider's statements to them.  CHIEF COMPLAINT:  CC: History of stage IA triple negative right breast cancer   Current Treatment:  Surveillance   HISTORY OF PRESENT ILLNESS:  Briunna Leicht is a 76 y.o. female with stage IA (T1b N0 M0) triple negative right breast cancer diagnosed in March 2018.  She was treated with lumpectomy.  Pathology revealed a 1 cm, grade 2, invasive ductal carcinoma.  One sentinel node was negative for metastasis.  Estrogen and progesterone receptors were negative.  HER 2 by IHC was equivocal, but HER 2 by FISH was negative.  Adjuvant chemotherapy was recommended with 4 cycles of docetaxel and cyclophosphamide beginning in May 2018.  Prior to her second cycle in June, she reported episodes of shortness of breath not necessarily associated with exertion, as well as intermittent chest tightness, which occasionally woke her at night, and a sensation of her heart racing.    EKG was normal and she was asymptomatic, so we advised her to see Dr. Laqueta Due if she had recurrent symptoms.  Unfortunately, she did have recurrent dyspnea and CT chest in June revealed a right lower lobe subsegmental pulmonary embolism.  Dr. Laqueta Due placed the patient on rivaroxaban 15 mg twice daily.  The patient then developed a diffuse rash with cephalexin, which she was on for mastitis.  She has had chronic pain in the right breast since surgery.  In August 2018, she reported shortness of breath and a heaviness in her chest again.  CTA chest at that time revealed resolution of  the previously seen pulmonary embolism.  Rivaroxaban was discontinued and she was placed on aspirin to 81 mg daily.  She received adjuvant radiation to the right breast, completed in September 2018.  Her port was subsequently removed.  When she was seen in March 2021, she was doing fairly well.  She had a bilateral diagnostic mammogram in April, which revealed a new area asymmetry with associated calcifications in the lateral right breast.  There were postoperative changes at the right lumpectomy site with an associated seroma, which were stable.  Ultrasound revealed a hypoechoic mass in the right lateral breast, measuring 8 mm in greatest dimension, at 9:30 o'clock 4 cm from the nipple.  Biopsy was recommended.  Pathology revealed breast and adipose tissue with fat necrosis and dense fibrosis.  No atypia, in situ or invasive malignancy was identified.  In July she reported a mass in the right breast near her surgical excision.  Diagnostic right mammogram from July revealed an indeterminate adjacent masses in the upper inner quadrant of the right breast at the 12:30 oclock position and the 1 oclock position.  Biopsies from July 28th confirmed fat necrosis with fibrosis and were benign.  She did contract COVID back in September. Annual bilateral mammogram from April 2022 was clear.   INTERVAL HISTORY:  Karlene is here for routine follow up and states that she has been well. She continues to have occasional pain of the right breast. Rarely she requires Advil. Otherwise, she denies complaints. Bone density from January 2023 revealed mildly worsened osteopenia with a T-score  of -2.1 of the left femur, previously -1.7. Dual femur total mean measures -1.7, previously -1.4. AP spine is normal at -0.4. She continues oral calcium and vitamin D. Unilateral diagnostic mammogram from February 9th through Dr. Noberto Retort revealed stable appearance of the right breast with no mammographic evidence malignancy. Annual bilateral  mammogram in April as well as MRI is recommended., and Dr. Noberto Retort will follow up on this. Her  appetite is good, and her weight is stable since her last visit.  She denies fever, chills or other signs of infection.  She denies nausea, vomiting, bowel issues, or abdominal pain.  She denies sore throat, cough, dyspnea, or chest pain.  REVIEW OF SYSTEMS:  Review of Systems  Constitutional: Negative.  Negative for appetite change, chills, fatigue, fever and unexpected weight change.  HENT:  Negative.    Eyes: Negative.   Respiratory: Negative.  Negative for chest tightness, cough, hemoptysis, shortness of breath and wheezing.   Cardiovascular: Negative.  Negative for chest pain, leg swelling and palpitations.  Gastrointestinal: Negative.  Negative for abdominal distention, abdominal pain, blood in stool, constipation, diarrhea, nausea and vomiting.  Endocrine: Negative.   Genitourinary: Negative.  Negative for difficulty urinating, dysuria, frequency and hematuria.   Musculoskeletal:  Negative for arthralgias, back pain, flank pain, gait problem and myalgias.       Occasional right breast pain  Skin: Negative.   Neurological: Negative.  Negative for dizziness, extremity weakness, gait problem, headaches, light-headedness, numbness, seizures and speech difficulty.  Hematological: Negative.   Psychiatric/Behavioral: Negative.  Negative for depression and sleep disturbance. The patient is not nervous/anxious.     VITALS:  Blood pressure (!) 159/76, pulse 78, temperature 98.1 F (36.7 C), temperature source Oral, resp. rate 20, height 5' 4.25" (1.632 m), weight 122 lb 12.8 oz (55.7 kg), SpO2 98 %.  Wt Readings from Last 3 Encounters:  10/07/21 122 lb 12.8 oz (55.7 kg)  04/06/21 122 lb 12.8 oz (55.7 kg)  10/07/20 121 lb 8 oz (55.1 kg)    Body mass index is 20.91 kg/m.  Performance status (ECOG): 1 - Symptomatic but completely ambulatory  PHYSICAL EXAM:  Physical Exam Constitutional:       General: She is not in acute distress.    Appearance: Normal appearance. She is normal weight.  HENT:     Head: Normocephalic and atraumatic.  Eyes:     General: No scleral icterus.    Extraocular Movements: Extraocular movements intact.     Conjunctiva/sclera: Conjunctivae normal.     Pupils: Pupils are equal, round, and reactive to light.  Cardiovascular:     Rate and Rhythm: Normal rate and regular rhythm.     Pulses: Normal pulses.     Heart sounds: Normal heart sounds. No murmur heard.   No friction rub. No gallop.  Pulmonary:     Effort: Pulmonary effort is normal. No respiratory distress.     Breath sounds: Normal breath sounds.  Chest:     Comments: Deep scar in the lower inner quadrant of the right breast with a surrounding area of firmness which is nonspecific. There is another area of firmness in the upper right breast at about 12 o'clock. No masses in either breast. Abdominal:     General: Bowel sounds are normal. There is no distension.     Palpations: Abdomen is soft. There is no mass.     Tenderness: There is no abdominal tenderness.  Musculoskeletal:        General: Normal range of  motion.     Cervical back: Normal range of motion and neck supple.     Right lower leg: No edema.     Left lower leg: No edema.  Lymphadenopathy:     Cervical: No cervical adenopathy.  Skin:    General: Skin is warm and dry.  Neurological:     General: No focal deficit present.     Mental Status: She is alert and oriented to person, place, and time. Mental status is at baseline.  Psychiatric:        Mood and Affect: Mood normal.        Behavior: Behavior normal.        Thought Content: Thought content normal.        Judgment: Judgment normal.    LABS:   CBC Latest Ref Rng & Units 10/07/2021  WBC - 4.2  Hemoglobin 12.0 - 16.0 13.2  Hematocrit 36 - 46 39  Platelets 150 - 399 346   CMP Latest Ref Rng & Units 10/07/2021  BUN 4 - 21 17  Creatinine 0.5 - 1.1 0.7  Sodium 137 -  147 136(A)  Potassium 3.4 - 5.3 4.3  Chloride 99 - 108 102  CO2 13 - 22 27(A)  Calcium 8.7 - 10.7 8.9  Alkaline Phos 25 - 125 74  AST 13 - 35 34  ALT 7 - 35 20    STUDIES:    EXAM: 09/02/2021 DUAL X-RAY ABSORPTIOMETRY (DXA) FOR BONE MINERAL DENSITY   IMPRESSION:  Mariabella Vassey completed a BMD test on 09/02/2021 using the [place] (analysis version: 13.60) manufactured by [employer].  The following summarizes the results of our evaluation.  TECHNOLOGIST: Loman Chroman  PATIENT BIOGRAPHICAL:  Name: ELENER, CUSTODIO  Patient ID: [id number] [facility] Birth Date: [birth year] Height: 64.0 in.  Gender: Female Exam Date: 09/02/2021 Weight: 124.0 lbs.  Indications: E28.39 Fractures: Treatments:   ASSESSMENT:  The BMD measured at Femur Total Left is 0.747 g/cm2 with a T-score  of -2.1. This patient is considered osteopenic according to [facility]) criteria.  The scan quality is good.  Site Region Measured Measured WHO Young Adult BMD  Date Age Classification T-score   AP Spine L1-L4 09/02/2021 74.3 Normal -0.4 1.132 g/cm2   DualFemur Total Left 09/02/2021 74.3 Osteopenia -2.1 0.747 g/cm2  DualFemur Total Left 10/29/2018 71.5 Osteopenia -1.7 0.794 g/cm2  DualFemur Total Left 10/21/2016 69.4 Osteopenia -1.7 0.797 g/cm2  DualFemur Total Left 06/13/2014 67.1 Osteopenia -1.3 0.839 g/cm2  DualFemur Total Left 05/31/2013 66.0 Osteopenia -1.5 0.824 g/cm2  DualFemur Total Left 04/20/2012 64.9 Osteopenia -1.3 0.847 g/cm2   DualFemur Total Mean 09/02/2021 74.3 Osteopenia -1.7 0.794 g/cm2  DualFemur Total Mean 10/29/2018 71.5 Osteopenia -1.4 0.832 g/cm2  DualFemur Total Mean 10/21/2016 69.4 Osteopenia -1.3 0.842 g/cm2  DualFemur Total Mean 06/13/2014 67.1 Osteopenia -1.2 0.863 g/cm2  DualFemur Total Mean 05/31/2013 66.0 Osteopenia -2.4 0.709 g/cm2  DualFemur Total Mean 04/20/2012 64.9 Osteopenia -1.1 0.869 g/cm2    EXAM: 09/23/2021 DIGITAL DIAGNOSTIC UNILATERAL RIGHT MAMMOGRAM WITH  TOMOSYNTHESIS AND  CAD   TECHNIQUE:  Right digital diagnostic mammography and breast tomosynthesis was  performed. The images were evaluated with computer-aided detection.   COMPARISON: Previous exams.   ACR Breast Density Category b: There are scattered areas of  fibroglandular density.   FINDINGS:  No suspicious masses or calcifications seen in the right breast.  Lumpectomy changes are again identified in the right breast with  multiple biopsy marking clips present at site  of benign biopsies.  There are no suspicious masses or any other abnormalities in the  right breast to suggest malignancy.   IMPRESSION:  Stable appearance of the right breast. No mammographic evidence  malignancy.   Allergies:  Allergies  Allergen Reactions   Rivaroxaban Rash   Sulfamethoxazole Rash    Current Medications: Current Outpatient Medications  Medication Sig Dispense Refill   amLODipine (NORVASC) 5 MG tablet Take 5 mg by mouth daily.     ascorbic acid (VITAMIN C) 500 MG tablet Take by mouth.     aspirin 81 MG EC tablet Take by mouth.     Biotin 10000 MCG TABS Take by mouth.     Cholecalciferol (VITAMIN D3) 50 MCG (2000 UT) TABS Take by mouth.     melatonin 5 MG TABS Take 5 mg by mouth at bedtime.     Omega-3 1000 MG CAPS Take by mouth.     rosuvastatin (CRESTOR) 5 MG tablet Take by mouth.     THYROID PO Take by mouth. Thyroid Support Herbal Supplement     Turmeric 500 MG TABS Take 15,000 mg by mouth.     Zinc 25 MG TABS Take by mouth.     No current facility-administered medications for this visit.     ASSESSMENT & PLAN:   Assessment:   1.  Stage IA triple negative right breast cancer, diagnosed in March 2018.  She remains without evidence of recurrence now 5 years postop. Repeat mammogram remains without evidence of disease.  2.  Biopsies of the right breast from March 2021 and July were consistent with fat necrosis at 1 o'clock and 12:30 o'clock. We will continue to monitor. She  will have repeat bilateral mammograms in April 2023.  3. Osteopenia with some worsening. She is already on calcium and vitamin D, this will be managed through her PCP.  Plan: We will see her back in 6 months for repeat examination.  If all is well, we can go to yearly follow up after that so that we can alternate with Dr. Venita Sheffield follow up. She will be due for annual bilateral mammography and MRI in April, which is scheduled through Dr. Noberto Retort. She verbalizes understanding of and agreement to the plans discussed today. She knows to call the office should any new questions or concerns arise.   I provided 15 minutes of face-to-face time during this this encounter and > 50% was spent counseling as documented under my assessment and plan.    Derwood Kaplan, MD [facility] [address] [place] [address] Dept: [phone] Dept Fax: [phone]    I, Rita Ohara, am acting as scribe for Derwood Kaplan, MD  I have reviewed this report as typed by the medical scribe, and it is complete and accurate.

## 2021-10-06 ENCOUNTER — Other Ambulatory Visit: Payer: Self-pay | Admitting: Oncology

## 2021-10-06 DIAGNOSIS — C50311 Malignant neoplasm of lower-inner quadrant of right female breast: Secondary | ICD-10-CM

## 2021-10-07 ENCOUNTER — Other Ambulatory Visit: Payer: Self-pay

## 2021-10-07 ENCOUNTER — Encounter: Payer: Self-pay | Admitting: Oncology

## 2021-10-07 ENCOUNTER — Inpatient Hospital Stay: Payer: Medicare Other | Attending: Oncology

## 2021-10-07 ENCOUNTER — Telehealth: Payer: Self-pay | Admitting: Oncology

## 2021-10-07 ENCOUNTER — Inpatient Hospital Stay (INDEPENDENT_AMBULATORY_CARE_PROVIDER_SITE_OTHER): Payer: Medicare Other | Admitting: Oncology

## 2021-10-07 VITALS — BP 159/76 | HR 78 | Temp 98.1°F | Resp 20 | Ht 64.25 in | Wt 122.8 lb

## 2021-10-07 DIAGNOSIS — I2694 Multiple subsegmental pulmonary emboli without acute cor pulmonale: Secondary | ICD-10-CM

## 2021-10-07 DIAGNOSIS — Z171 Estrogen receptor negative status [ER-]: Secondary | ICD-10-CM | POA: Diagnosis not present

## 2021-10-07 DIAGNOSIS — C50311 Malignant neoplasm of lower-inner quadrant of right female breast: Secondary | ICD-10-CM

## 2021-10-07 DIAGNOSIS — C50111 Malignant neoplasm of central portion of right female breast: Secondary | ICD-10-CM | POA: Diagnosis not present

## 2021-10-07 LAB — CBC: RBC: 4.31 (ref 3.87–5.11)

## 2021-10-07 LAB — BASIC METABOLIC PANEL
BUN: 17 (ref 4–21)
CO2: 27 — AB (ref 13–22)
Chloride: 102 (ref 99–108)
Creatinine: 0.7 (ref 0.5–1.1)
Glucose: 92
Potassium: 4.3 (ref 3.4–5.3)
Sodium: 136 — AB (ref 137–147)

## 2021-10-07 LAB — COMPREHENSIVE METABOLIC PANEL
Albumin: 4.2 (ref 3.5–5.0)
Calcium: 8.9 (ref 8.7–10.7)

## 2021-10-07 LAB — CBC AND DIFFERENTIAL
HCT: 39 (ref 36–46)
Hemoglobin: 13.2 (ref 12.0–16.0)
Neutrophils Absolute: 2.73
Platelets: 346 (ref 150–399)
WBC: 4.2

## 2021-10-07 LAB — HEPATIC FUNCTION PANEL
ALT: 20 (ref 7–35)
AST: 34 (ref 13–35)
Alkaline Phosphatase: 74 (ref 25–125)
Bilirubin, Total: 0.6

## 2021-10-07 NOTE — Telephone Encounter (Signed)
Patient has been scheduled for follow-up visit per 10/07/21 los. Pt given an appt calendar with date and time. ° °

## 2021-10-14 ENCOUNTER — Other Ambulatory Visit: Payer: Self-pay | Admitting: Vascular Surgery

## 2021-10-14 DIAGNOSIS — C50311 Malignant neoplasm of lower-inner quadrant of right female breast: Secondary | ICD-10-CM

## 2021-11-05 DIAGNOSIS — H25812 Combined forms of age-related cataract, left eye: Secondary | ICD-10-CM | POA: Diagnosis not present

## 2021-11-15 ENCOUNTER — Other Ambulatory Visit: Payer: Medicare Other

## 2021-11-29 ENCOUNTER — Ambulatory Visit
Admission: RE | Admit: 2021-11-29 | Discharge: 2021-11-29 | Disposition: A | Discharge status: Home / Self Care | Payer: Medicare Other | Source: Ambulatory Visit | Attending: Vascular Surgery | Admitting: Vascular Surgery

## 2021-11-29 DIAGNOSIS — C50912 Malignant neoplasm of unspecified site of left female breast: Secondary | ICD-10-CM | POA: Diagnosis not present

## 2021-11-29 DIAGNOSIS — C50911 Malignant neoplasm of unspecified site of right female breast: Secondary | ICD-10-CM | POA: Diagnosis not present

## 2021-11-29 DIAGNOSIS — C50311 Malignant neoplasm of lower-inner quadrant of right female breast: Secondary | ICD-10-CM

## 2021-11-29 IMAGING — MR MR BREAST BILAT WO/W CM
8 of 12 series · 32 of 48 positions shown · IV contrast (6 ml gadavist)
Comparison: Previous exam(s).

CLINICAL DATA: 75-year-old female with history of right breast
lumpectomy in [46] for invasive mammary carcinoma in the lower-inner
right breast. Subsequently, she has had multiple biopsies in the
right breast with results indicating fat necrosis. She presents
today to ensure stability of presumed fat necrosis in the right
breast.

EXAM:
BILATERAL BREAST MRI WITH AND WITHOUT CONTRAST
TECHNIQUE: Multiplanar, multisequence MR images of both breasts were obtained
prior to and following the intravenous administration of 5 ml of
Gadavist

[Series 2: t2_tirm_tra ipat (a-p) · axial · 3.0mm · 0.70mm/px · 1 of 55 slices shown]
[im 1/55]
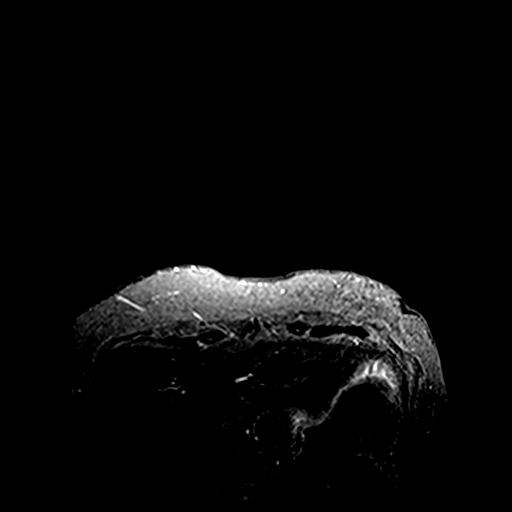

[Series 3: fl3d pre-cm no · axial · non-contrast · 1.2mm · 0.91mm/px · z∈[-81,+90]mm · 5 of 144 slices shown]
[im 1/144]
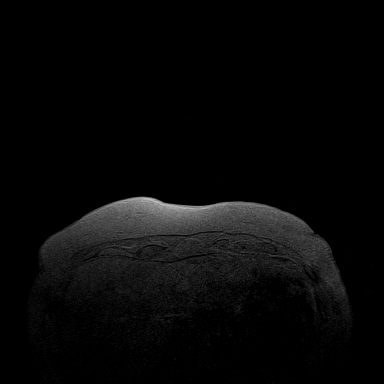
[im 36/144]
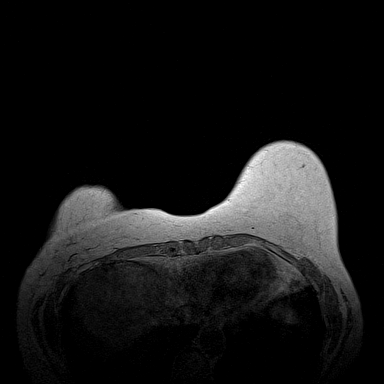
[im 72/144]
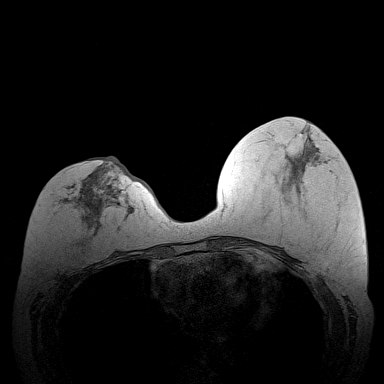
[im 108/144]
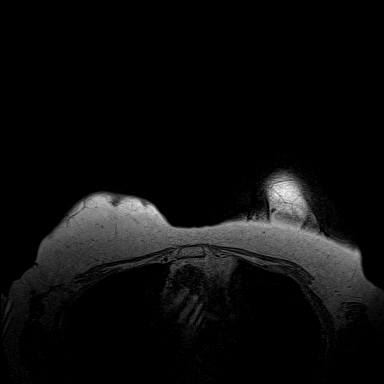
[im 144/144]
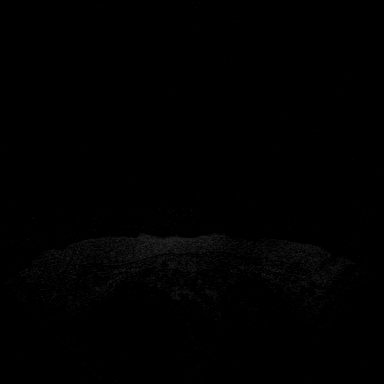

[Series 4: fl3d pre-cm · axial · non-contrast · 1.2mm · 0.94mm/px · z∈[-81,+90]mm · 5 of 144 slices shown]
[im 1/144]
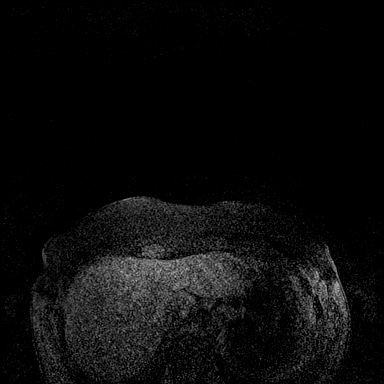
[im 36/144]
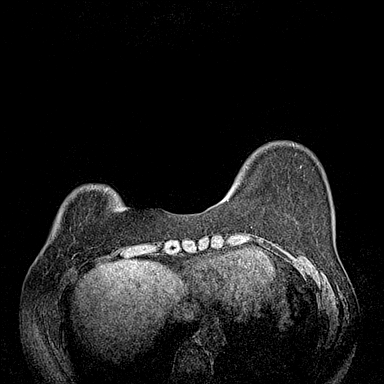
[im 72/144]
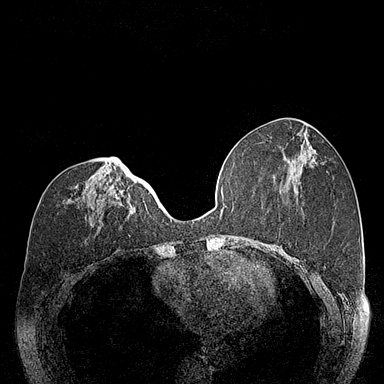
[im 108/144]
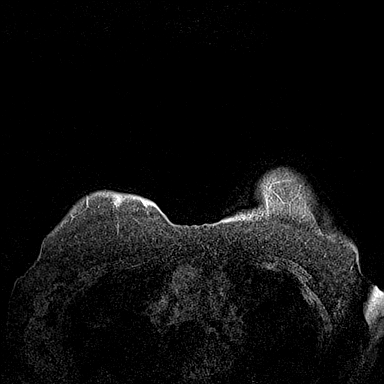
[im 144/144]
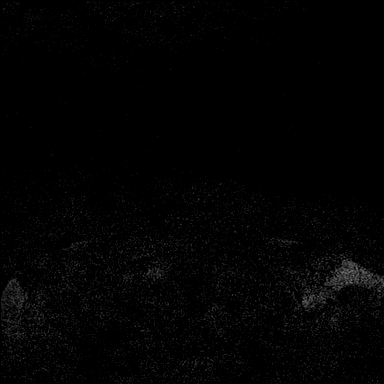

[Series 5: fl3d post-cm 20 · axial · 1.2mm · 0.94mm/px · z∈[-81,+90]mm · 5 of 144 slices shown (1 of 3)]
[im 1/144]
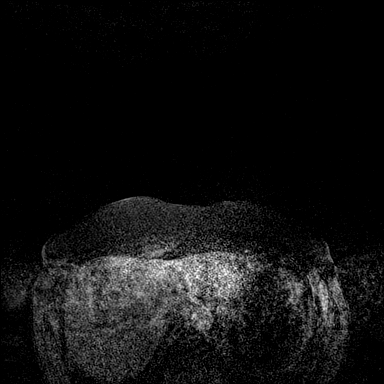
[im 36/144]
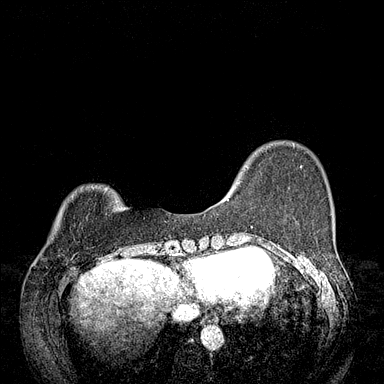
[im 72/144]
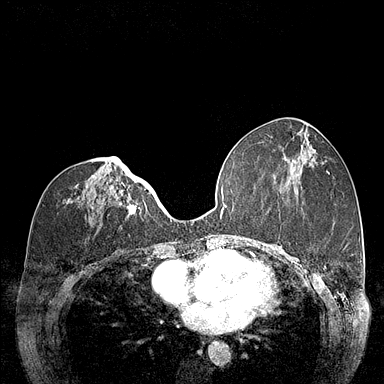
[im 108/144]
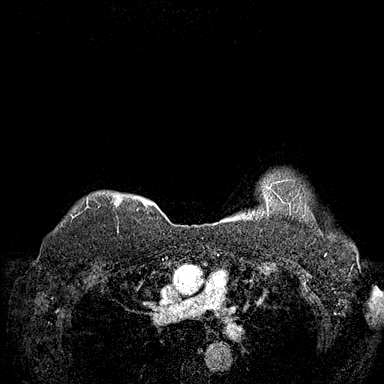
[im 144/144]
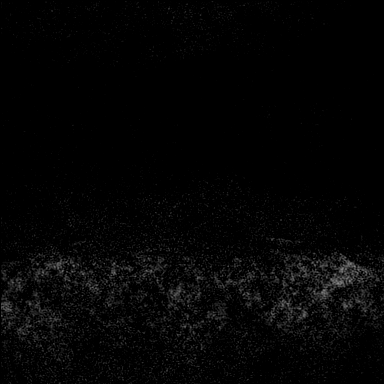

[Series 6: fl3d post-cm 20 · axial · 1.2mm · 0.94mm/px · z∈[-81,+90]mm · 5 of 144 slices shown (2 of 3)]
[im 1/144]
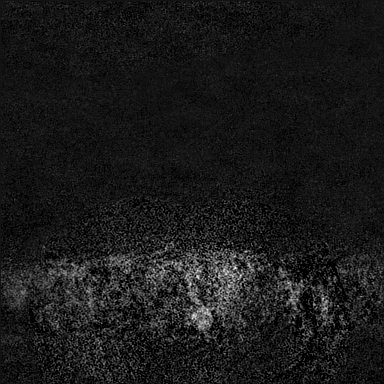
[im 36/144]
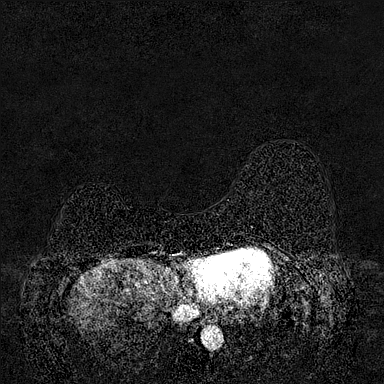
[im 72/144]
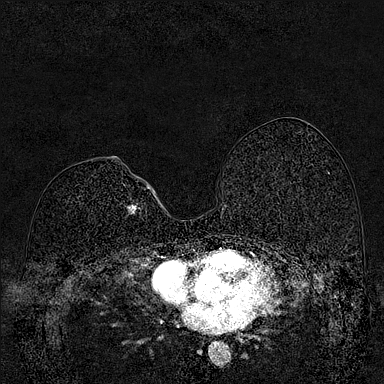
[im 108/144]
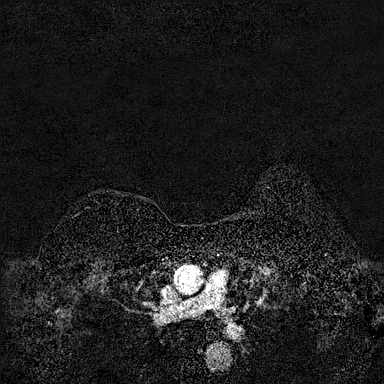
[im 144/144]
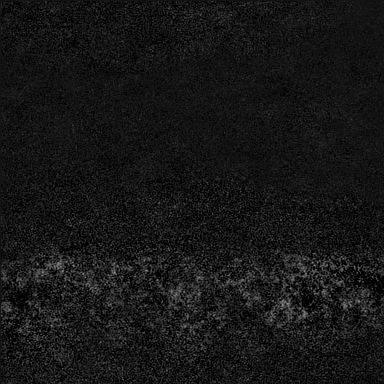

[Series 7: fl3d post-cm 20 · axial · 172.8mm · 0.94mm/px · 1 of 1 slices shown (3 of 3)]
[im 1/1]
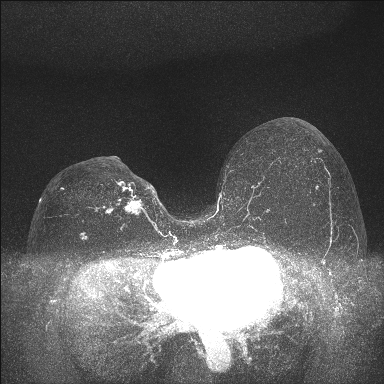

[Series 8: fl3d post-cm 3 · axial · 1.2mm · 0.94mm/px · z∈[-81,+90]mm · 6 of 144 slices shown (1 of 2)]
[im 1/144]
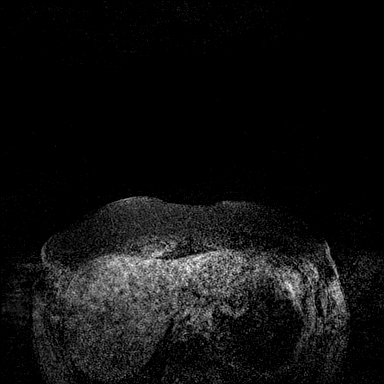
[im 29/144]
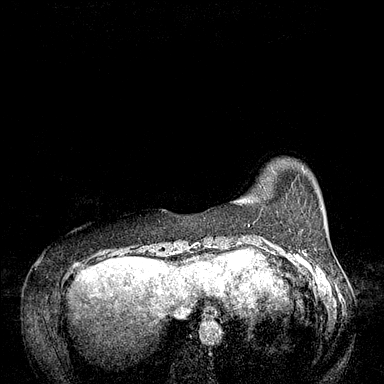
[im 58/144]
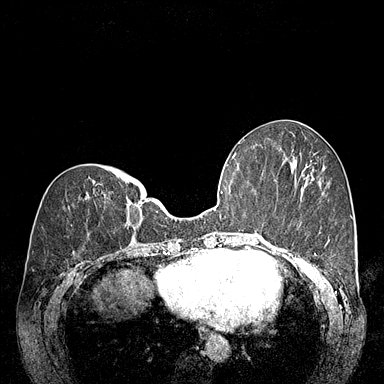
[im 86/144]
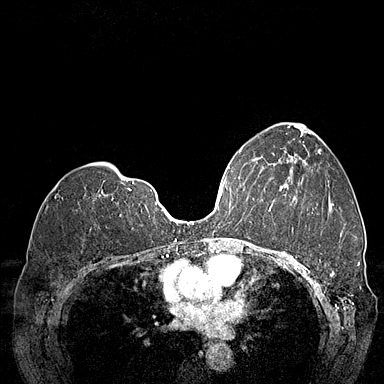
[im 115/144]
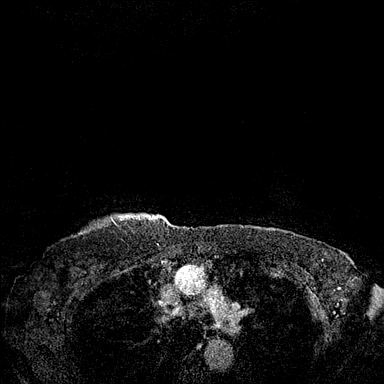
[im 144/144]
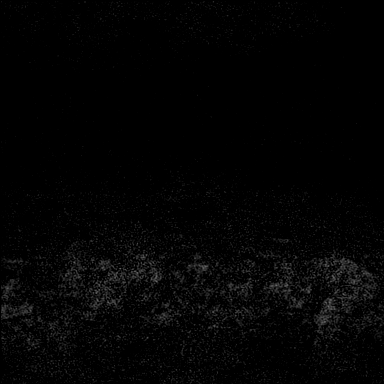

[Series 9: fl3d post-cm 3 · axial · 1.2mm · 0.94mm/px · z∈[-81,+21]mm · 4 of 144 slices shown (2 of 2)]
[im 1/144]
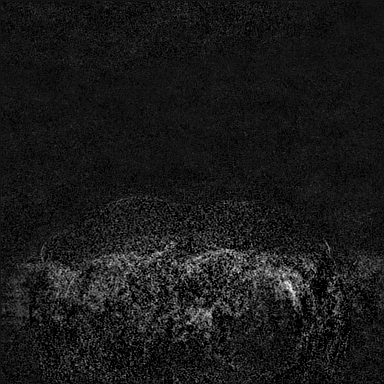
[im 29/144]
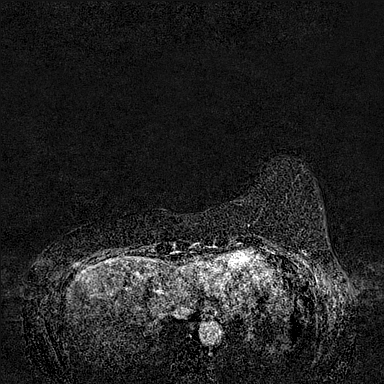
[im 58/144]
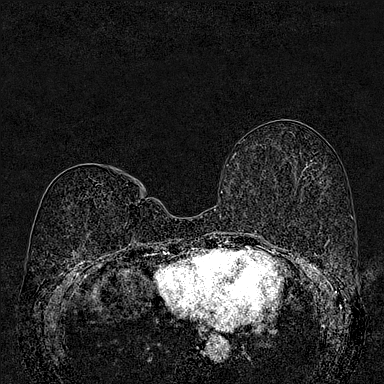
[im 86/144]
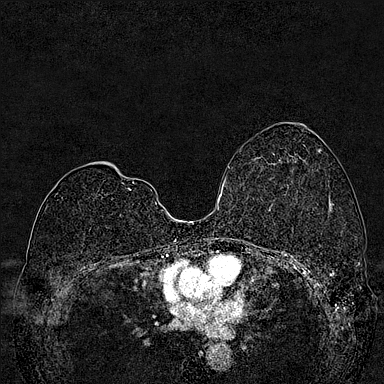

[32 of 48 positions shown; findings below may reference images not displayed]

Three-dimensional MR images were rendered by post-processing of the
original MR data on an independent workstation. The
three-dimensional MR images were interpreted, and findings are
reported in the following complete MRI report for this study. Three
dimensional images were evaluated at the independent interpreting
workstation using the DynaCAD thin client.
FINDINGS: Breast composition: b. Scattered fibroglandular tissue.

Background parenchymal enhancement: Minimal

Right breast: Multiple small scattered enhancing masses are seen
throughout the right breast, the majority which are associated with
biopsy marking clips consistent with areas of biopsied fat necrosis.

There are 2 enhancing masses that are not associated with biopsy
markers. One is in the upper inner anterior right breast (series 6,
image 67). This demonstrates less central enhancement, and appears
less masslike than on the comparison exam. This measures 0.9 cm,
compared with 1.0 cm on the prior exam.

The other enhancing mass in the lower inner posterior right breast
along the superior margin of the post lumpectomy seroma (series 6,
image 80). This measures 1.3 cm compared with 1.2 cm on the
comparison study. No new areas of enhancement are found.

Left breast: No mass or abnormal enhancement.

Lymph nodes: No abnormal appearing lymph nodes.

Ancillary findings:  None.
IMPRESSION: 1. No significant interval change in the 2 non biopsied enhancing
masses in medial right breast, suspected to represent fat necrosis.
No suspicious changes of the biopsied fat necrosis in the right
breast.

2.  No evidence of left breast malignancy.

RECOMMENDATION:
1. One year follow-up contrast enhanced breast MRI for the likely
benign areas of fat necrosis in the right breast.

2. The patient is due for her annual bilateral diagnostic
mammography.

BI-RADS CATEGORY  3: Probably benign.

## 2021-11-29 MED ORDER — GADOBUTROL 1 MMOL/ML IV SOLN
6.0000 mL | Freq: Once | INTRAVENOUS | Status: AC | PRN
  Administered 2021-11-29: 6 mL via INTRAVENOUS

## 2021-12-15 DIAGNOSIS — R928 Other abnormal and inconclusive findings on diagnostic imaging of breast: Secondary | ICD-10-CM | POA: Diagnosis not present

## 2021-12-15 DIAGNOSIS — C50311 Malignant neoplasm of lower-inner quadrant of right female breast: Secondary | ICD-10-CM | POA: Diagnosis not present

## 2021-12-28 DIAGNOSIS — N641 Fat necrosis of breast: Secondary | ICD-10-CM | POA: Diagnosis not present

## 2021-12-28 DIAGNOSIS — Z171 Estrogen receptor negative status [ER-]: Secondary | ICD-10-CM | POA: Diagnosis not present

## 2021-12-28 DIAGNOSIS — C50311 Malignant neoplasm of lower-inner quadrant of right female breast: Secondary | ICD-10-CM | POA: Diagnosis not present

## 2022-02-08 DIAGNOSIS — E039 Hypothyroidism, unspecified: Secondary | ICD-10-CM | POA: Diagnosis not present

## 2022-02-08 DIAGNOSIS — Z79899 Other long term (current) drug therapy: Secondary | ICD-10-CM | POA: Diagnosis not present

## 2022-02-08 DIAGNOSIS — R7301 Impaired fasting glucose: Secondary | ICD-10-CM | POA: Diagnosis not present

## 2022-02-08 DIAGNOSIS — Z6821 Body mass index (BMI) 21.0-21.9, adult: Secondary | ICD-10-CM | POA: Diagnosis not present

## 2022-02-08 DIAGNOSIS — I10 Essential (primary) hypertension: Secondary | ICD-10-CM | POA: Diagnosis not present

## 2022-02-08 DIAGNOSIS — E785 Hyperlipidemia, unspecified: Secondary | ICD-10-CM | POA: Diagnosis not present

## 2022-04-04 ENCOUNTER — Other Ambulatory Visit: Payer: Self-pay

## 2022-04-05 ENCOUNTER — Other Ambulatory Visit: Payer: Self-pay | Admitting: Hematology and Oncology

## 2022-04-05 DIAGNOSIS — Z171 Estrogen receptor negative status [ER-]: Secondary | ICD-10-CM

## 2022-04-06 ENCOUNTER — Encounter: Payer: Self-pay | Admitting: Hematology and Oncology

## 2022-04-06 ENCOUNTER — Inpatient Hospital Stay (INDEPENDENT_AMBULATORY_CARE_PROVIDER_SITE_OTHER): Payer: Medicare Other | Admitting: Hematology and Oncology

## 2022-04-06 ENCOUNTER — Inpatient Hospital Stay: Payer: Medicare Other | Attending: Hematology and Oncology

## 2022-04-06 ENCOUNTER — Ambulatory Visit: Payer: Medicare Other | Admitting: Hematology and Oncology

## 2022-04-06 DIAGNOSIS — C50311 Malignant neoplasm of lower-inner quadrant of right female breast: Secondary | ICD-10-CM | POA: Diagnosis not present

## 2022-04-06 DIAGNOSIS — D649 Anemia, unspecified: Secondary | ICD-10-CM | POA: Diagnosis not present

## 2022-04-06 DIAGNOSIS — Z171 Estrogen receptor negative status [ER-]: Secondary | ICD-10-CM | POA: Diagnosis not present

## 2022-04-06 LAB — HEPATIC FUNCTION PANEL
ALT: 21 U/L (ref 7–35)
AST: 36 — AB (ref 13–35)
Alkaline Phosphatase: 74 (ref 25–125)
Bilirubin, Total: 0.6

## 2022-04-06 LAB — BASIC METABOLIC PANEL
BUN: 14 (ref 4–21)
CO2: 28 — AB (ref 13–22)
Chloride: 99 (ref 99–108)
Creatinine: 0.7 (ref 0.5–1.1)
Glucose: 115
Potassium: 4.4 mEq/L (ref 3.5–5.1)
Sodium: 134 — AB (ref 137–147)

## 2022-04-06 LAB — COMPREHENSIVE METABOLIC PANEL
Albumin: 4.5 (ref 3.5–5.0)
Calcium: 9.3 (ref 8.7–10.7)

## 2022-04-06 LAB — CBC AND DIFFERENTIAL
HCT: 38 (ref 36–46)
Hemoglobin: 12.8 (ref 12.0–16.0)
Neutrophils Absolute: 2.46
Platelets: 335 10*3/uL (ref 150–400)
WBC: 4.4

## 2022-04-06 LAB — CBC: RBC: 4.24 (ref 3.87–5.11)

## 2022-04-06 NOTE — Assessment & Plan Note (Addendum)
Stage IA triple negative right breast cancer, diagnosed in March 2018.  She remains without evidence of recurrence now 5 years postop. Repeat mammogram remains without evidence of disease. Biopsies of the right breast from March 2021 and July were consistent with fat necrosis at 1 o'clock and 12:30 o'clock. We will continue to monitor. Repeat bilateral mammograms in April 2023 revealed probable fat necrosis and recommend annual bilateral diagnostic mammogram. Exam benign today other than continued tenderness to the right breast. She will return to clinic in 6 months for repeat evaluation.

## 2022-04-06 NOTE — Progress Notes (Signed)
Patient Care Team: Ernestene Kiel, MD as PCP - General (Internal Medicine)  Clinic Day:  04/06/2022  Referring physician: Ernestene Kiel, MD  ASSESSMENT & PLAN:   Assessment & Plan: Breast cancer of lower-inner quadrant of right female breast [facility]) Stage IA triple negative right breast cancer, diagnosed in March 2018.  She remains without evidence of recurrence now 5 years postop. Repeat mammogram remains without evidence of disease. Biopsies of the right breast from March 2021 and July were consistent with fat necrosis at 1 o'clock and 12:30 o'clock. We will continue to monitor. Repeat bilateral mammograms in April 2023 revealed probable fat necrosis and recommend annual bilateral diagnostic mammogram. Exam benign today other than continued tenderness to the right breast. She will return to clinic in 6 months for repeat evaluation.      The patient understands the plans discussed today and is in agreement with them.  She knows to contact our office if she develops concerns prior to her next appointment.    Brianna Ped, NP  [place] [address] [place] [address] Dept: [phone] Dept Fax: [phone]   Orders Placed This Encounter  Procedures   CBC and differential    This external order was created through the Results Console.   CBC    This external order was created through the Results Console.   Basic metabolic panel    This external order was created through the Results Console.   Comprehensive metabolic panel    This external order was created through the Results Console.   Hepatic function panel    This external order was created through the Results Console.      CHIEF COMPLAINT:  CC: A 76 year old female with history of breast cancer here for 6 month evaluation  Current Treatment:  Surveillance  INTERVAL HISTORY:  Brianna Leonard is here today for repeat clinical assessment. She  denies fevers or chills. She denies pain. Her appetite is good. Her weight has been stable.  I have reviewed the past medical history, past surgical history, social history and family history with the patient and they are unchanged from previous note.  ALLERGIES:  is allergic to rivaroxaban and sulfamethoxazole.  MEDICATIONS:  Current Outpatient Medications  Medication Sig Dispense Refill   amLODipine (NORVASC) 5 MG tablet Take 5 mg by mouth daily.     ascorbic acid (VITAMIN C) 500 MG tablet Take by mouth.     aspirin 81 MG EC tablet Take by mouth.     Biotin 10000 MCG TABS Take by mouth.     Cholecalciferol (VITAMIN D3) 50 MCG (2000 UT) TABS Take by mouth.     melatonin 5 MG TABS Take 5 mg by mouth at bedtime.     Omega-3 1000 MG CAPS Take by mouth.     rosuvastatin (CRESTOR) 20 MG tablet Take by mouth.     THYROID PO Take by mouth. Thyroid Support Herbal Supplement     Turmeric 500 MG TABS Take 15,000 mg by mouth.     Zinc 25 MG TABS Take by mouth.     No current facility-administered medications for this visit.    HISTORY OF PRESENT ILLNESS:   Oncology History  Breast cancer of lower-inner quadrant of right female breast ([facility])  10/30/2016 Cancer Staging   Staging form: Breast, AJCC 8th Edition - Clinical stage from 10/30/2016: Stage IB (cT1b, cN0(sn), cM0, G2, ER-, PR-, HER2-) - Signed by Derwood Kaplan, MD  on 10/07/2020 Histopathologic type: Infiltrating duct carcinoma, NOS Stage prefix: Initial diagnosis Method of lymph node assessment: Sentinel lymph node biopsy Nuclear grade: G2 Multigene prognostic tests performed: None Menopausal status: Postmenopausal Stage used in treatment planning: Yes National guidelines used in treatment planning: Yes Type of national guideline used in treatment planning: NCCN Staging comments: 10 mm, had TC adjuvant chemo x 4 cycles   11/04/2016 Initial Diagnosis   Breast cancer of lower-inner quadrant of right female breast ([facility])        REVIEW OF SYSTEMS:   Constitutional: Denies fevers, chills or abnormal weight loss Eyes: Denies blurriness of vision Ears, nose, mouth, throat, and face: Denies mucositis or sore throat Respiratory: Denies cough, dyspnea or wheezes Cardiovascular: Denies palpitation, chest discomfort or lower extremity swelling Gastrointestinal:  Denies nausea, heartburn or change in bowel habits Skin: Denies abnormal skin rashes Lymphatics: Denies new lymphadenopathy or easy bruising Neurological:Denies numbness, tingling or new weaknesses Behavioral/Psych: Mood is stable, no new changes  All other systems were reviewed with the patient and are negative.   VITALS:  Blood pressure (!) 166/79, pulse 79, temperature 98.3 F (36.8 C), temperature source Oral, resp. rate 20, height 5' 4.25" (1.632 m), weight 122 lb 9.6 oz (55.6 kg), SpO2 97 %.  Wt Readings from Last 3 Encounters:  04/06/22 122 lb 9.6 oz (55.6 kg)  10/07/21 122 lb 12.8 oz (55.7 kg)  04/06/21 122 lb 12.8 oz (55.7 kg)    Body mass index is 20.88 kg/m.  Performance status (ECOG): 1 - Symptomatic but completely ambulatory  PHYSICAL EXAM:   GENERAL:alert, no distress and comfortable SKIN: skin color, texture, turgor are normal, no rashes or significant lesions EYES: normal, Conjunctiva are pink and non-injected, sclera clear OROPHARYNX:no exudate, no erythema and lips, buccal mucosa, and tongue normal  NECK: supple, thyroid normal size, non-tender, without nodularity LYMPH:  no palpable lymphadenopathy in the cervical, axillary or inguinal LUNGS: clear to auscultation and percussion with normal breathing effort HEART: regular rate & rhythm and no murmurs and no lower extremity edema ABDOMEN:abdomen soft, non-tender and normal bowel sounds Musculoskeletal:no cyanosis of digits and no clubbing  NEURO: alert & oriented x 3 with fluent speech, no focal motor/sensory deficits  LABORATORY DATA:  I have reviewed the data as listed     Component Value Date/Time   NA 134 (A) 04/06/2022 0000   K 4.4 04/06/2022 0000   CL 99 04/06/2022 0000   CO2 28 (A) 04/06/2022 0000   BUN 14 04/06/2022 0000   CREATININE 0.7 04/06/2022 0000   CALCIUM 9.3 04/06/2022 0000   ALBUMIN 4.5 04/06/2022 0000   AST 36 (A) 04/06/2022 0000   ALT 21 04/06/2022 0000   ALKPHOS 74 04/06/2022 0000    No results found for: "SPEP", "UPEP"  Lab Results  Component Value Date   WBC 4.4 04/06/2022   NEUTROABS 2.46 04/06/2022   HGB 12.8 04/06/2022   HCT 38 04/06/2022   PLT 335 04/06/2022      Chemistry      Component Value Date/Time   NA 134 (A) 04/06/2022 0000   K 4.4 04/06/2022 0000   CL 99 04/06/2022 0000   CO2 28 (A) 04/06/2022 0000   BUN 14 04/06/2022 0000   CREATININE 0.7 04/06/2022 0000   GLU 115 04/06/2022 0000      Component Value Date/Time   CALCIUM 9.3 04/06/2022 0000   ALKPHOS 74 04/06/2022 0000   AST 36 (A) 04/06/2022 0000   ALT 21 04/06/2022 0000  RADIOGRAPHIC STUDIES: I have personally reviewed the radiological images as listed and agreed with the findings in the report. No results found. 

## 2022-05-24 DIAGNOSIS — D320 Benign neoplasm of cerebral meninges: Secondary | ICD-10-CM | POA: Diagnosis not present

## 2022-05-24 DIAGNOSIS — R220 Localized swelling, mass and lump, head: Secondary | ICD-10-CM | POA: Diagnosis not present

## 2022-05-24 DIAGNOSIS — D329 Benign neoplasm of meninges, unspecified: Secondary | ICD-10-CM | POA: Diagnosis not present

## 2022-05-24 DIAGNOSIS — I499 Cardiac arrhythmia, unspecified: Secondary | ICD-10-CM | POA: Diagnosis not present

## 2022-05-24 DIAGNOSIS — I10 Essential (primary) hypertension: Secondary | ICD-10-CM | POA: Diagnosis not present

## 2022-05-24 DIAGNOSIS — R519 Headache, unspecified: Secondary | ICD-10-CM | POA: Diagnosis not present

## 2022-05-24 DIAGNOSIS — R42 Dizziness and giddiness: Secondary | ICD-10-CM | POA: Diagnosis not present

## 2022-05-24 DIAGNOSIS — S0083XA Contusion of other part of head, initial encounter: Secondary | ICD-10-CM | POA: Diagnosis not present

## 2022-06-15 DIAGNOSIS — Z23 Encounter for immunization: Secondary | ICD-10-CM | POA: Diagnosis not present

## 2022-06-16 DIAGNOSIS — L821 Other seborrheic keratosis: Secondary | ICD-10-CM | POA: Diagnosis not present

## 2022-06-16 DIAGNOSIS — D225 Melanocytic nevi of trunk: Secondary | ICD-10-CM | POA: Diagnosis not present

## 2022-06-16 DIAGNOSIS — D2239 Melanocytic nevi of other parts of face: Secondary | ICD-10-CM | POA: Diagnosis not present

## 2022-06-16 DIAGNOSIS — L814 Other melanin hyperpigmentation: Secondary | ICD-10-CM | POA: Diagnosis not present

## 2022-07-05 DIAGNOSIS — R42 Dizziness and giddiness: Secondary | ICD-10-CM | POA: Diagnosis not present

## 2022-07-05 DIAGNOSIS — I6782 Cerebral ischemia: Secondary | ICD-10-CM | POA: Diagnosis not present

## 2022-07-05 DIAGNOSIS — Z6820 Body mass index (BMI) 20.0-20.9, adult: Secondary | ICD-10-CM | POA: Diagnosis not present

## 2022-07-05 DIAGNOSIS — R209 Unspecified disturbances of skin sensation: Secondary | ICD-10-CM | POA: Diagnosis not present

## 2022-07-05 DIAGNOSIS — D329 Benign neoplasm of meninges, unspecified: Secondary | ICD-10-CM | POA: Diagnosis not present

## 2022-08-16 DIAGNOSIS — E039 Hypothyroidism, unspecified: Secondary | ICD-10-CM | POA: Diagnosis not present

## 2022-08-16 DIAGNOSIS — Z6820 Body mass index (BMI) 20.0-20.9, adult: Secondary | ICD-10-CM | POA: Diagnosis not present

## 2022-08-16 DIAGNOSIS — Z0000 Encounter for general adult medical examination without abnormal findings: Secondary | ICD-10-CM | POA: Diagnosis not present

## 2022-08-16 DIAGNOSIS — E785 Hyperlipidemia, unspecified: Secondary | ICD-10-CM | POA: Diagnosis not present

## 2022-08-16 DIAGNOSIS — I10 Essential (primary) hypertension: Secondary | ICD-10-CM | POA: Diagnosis not present

## 2022-08-16 DIAGNOSIS — Z79899 Other long term (current) drug therapy: Secondary | ICD-10-CM | POA: Diagnosis not present

## 2022-08-16 DIAGNOSIS — Z1331 Encounter for screening for depression: Secondary | ICD-10-CM | POA: Diagnosis not present

## 2022-08-16 DIAGNOSIS — R7303 Prediabetes: Secondary | ICD-10-CM | POA: Diagnosis not present

## 2022-09-20 DIAGNOSIS — H6122 Impacted cerumen, left ear: Secondary | ICD-10-CM | POA: Diagnosis not present

## 2022-10-05 NOTE — Progress Notes (Signed)
[facility]  [address] [place],  [place]  [id number] [phone]  Clinic Day:  10/07/22   Referring physician: Ernestene Kiel, MD   CHIEF COMPLAINT:  CC: History of stage IA triple negative right breast cancer   Current Treatment:  Surveillance   HISTORY OF PRESENT ILLNESS:  Brianna Leonard is a 77 y.o. female with stage IA (T1b N0 M0) triple negative right breast cancer diagnosed in March 2018.  She was treated with lumpectomy.  Pathology revealed a 1 cm, grade 2, invasive ductal carcinoma.  One sentinel node was negative for metastasis.  Estrogen and progesterone receptors were negative.  HER 2 by IHC was equivocal, but HER 2 by FISH was negative.  Adjuvant chemotherapy was recommended with 4 cycles of docetaxel and cyclophosphamide beginning in May 2018.  Prior to her second cycle in June, she reported episodes of shortness of breath not necessarily associated with exertion, as well as intermittent chest tightness, which occasionally woke her at night, and a sensation of her heart racing.    EKG was normal and she was asymptomatic, so we advised her to see Dr. Laqueta Due if she had recurrent symptoms.  Unfortunately, she did have recurrent dyspnea and CT chest in June revealed a right lower lobe subsegmental pulmonary embolism.  Dr. Laqueta Due placed the patient on rivaroxaban 15 mg twice daily.  The patient then developed a diffuse rash with cephalexin, which she was on for mastitis.  She has had chronic pain in the right breast since surgery.  In August 2018, she reported shortness of breath and a heaviness in her chest again.  CTA chest at that time revealed resolution of the previously seen pulmonary embolism.  Rivaroxaban was discontinued and she was placed on aspirin to 81 mg daily.  She received adjuvant radiation to the right breast, completed in September 2018.  Her port was subsequently removed.  When she was seen in March 2021, she was doing  fairly well.  She had a bilateral diagnostic mammogram in April, which revealed a new area asymmetry with associated calcifications in the lateral right breast.  There were postoperative changes at the right lumpectomy site with an associated seroma, which were stable.  Ultrasound revealed a hypoechoic mass in the right lateral breast, measuring 8 mm in greatest dimension, at 9:30 o'clock 4 cm from the nipple.  Biopsy was recommended.  Pathology revealed breast and adipose tissue with fat necrosis and dense fibrosis.  No atypia, in situ or invasive malignancy was identified.  In July she reported a mass in the right breast near her surgical excision.  Diagnostic right mammogram from July revealed an indeterminate adjacent masses in the upper inner quadrant of the right breast at the 12:30 o'clock position and the 1 o'clock position.  Biopsies from July 28th confirmed fat necrosis with fibrosis and were benign.  She did contract COVID back in September. Annual bilateral mammogram from April 2022 was clear.   INTERVAL HISTORY:  Brianna Leonard is here for routine follow up for her history of stage IA triple negative right breast cancer. Patient states that she feels fine and only complains of some pain in her right breast when lifting weights as she tries to exercise more. She takes Advil or Ibuprofen to alleviate the pain. She will return to Dr. Noberto Retort in July, 2024 for her annual bilateral mammogram. I will change her to yearly appointments. Her labs today are pending and Dr. Laqueta Due will do her blood work. I will  see her in 1 year for reevaluation. She denies signs of infection such as sore throat, sinus drainage, cough, or urinary symptoms.  She denies fevers or recurrent chills. She denies pain. She denies nausea, vomiting, chest pain, dyspnea or cough. Her weight has been stable.  REVIEW OF SYSTEMS:  Review of Systems  Constitutional: Negative.  Negative for appetite change, chills, diaphoresis, fatigue, fever  and unexpected weight change.  HENT:  Negative.  Negative for hearing loss, lump/mass, mouth sores, nosebleeds, sore throat, tinnitus, trouble swallowing and voice change.   Eyes: Negative.  Negative for eye problems and icterus.  Respiratory: Negative.  Negative for chest tightness, cough, hemoptysis, shortness of breath and wheezing.   Cardiovascular: Negative.  Negative for chest pain (only when lifting weights), leg swelling and palpitations.  Gastrointestinal: Negative.  Negative for abdominal distention, abdominal pain, blood in stool, constipation, diarrhea, nausea, rectal pain and vomiting.  Endocrine: Negative.   Genitourinary: Negative.  Negative for bladder incontinence, difficulty urinating, dyspareunia, dysuria, frequency, hematuria, menstrual problem, nocturia, pelvic pain, vaginal bleeding and vaginal discharge.   Musculoskeletal:  Negative for arthralgias, back pain, flank pain, gait problem, myalgias, neck pain and neck stiffness.       Occasional right breast pain only when lifting weights  Skin: Negative.  Negative for itching, rash and wound.  Neurological: Negative.  Negative for dizziness, extremity weakness, gait problem, headaches, light-headedness, numbness, seizures and speech difficulty.  Hematological: Negative.  Negative for adenopathy. Does not bruise/bleed easily.  Psychiatric/Behavioral: Negative.  Negative for confusion, decreased concentration, depression, sleep disturbance and suicidal ideas. The patient is not nervous/anxious.     VITALS:  Blood pressure (!) 149/71, pulse 82, temperature 98.1 F (36.7 C), temperature source Oral, resp. rate 15, height 5' 4.25" (1.632 m), weight 123 lb 3.2 oz (55.9 kg), SpO2 99 %.  Wt Readings from Last 3 Encounters:  10/07/22 123 lb 3.2 oz (55.9 kg)  04/06/22 122 lb 9.6 oz (55.6 kg)  10/07/21 122 lb 12.8 oz (55.7 kg)    Body mass index is 20.98 kg/m.  Performance status (ECOG): 1 - Symptomatic but completely  ambulatory  PHYSICAL EXAM:  Physical Exam Vitals and nursing note reviewed.  Constitutional:      General: She is not in acute distress.    Appearance: Normal appearance. She is normal weight. She is not ill-appearing, toxic-appearing or diaphoretic.  HENT:     Head: Normocephalic and atraumatic.     Right Ear: Tympanic membrane, ear canal and external ear normal. There is no impacted cerumen.     Left Ear: Tympanic membrane, ear canal and external ear normal. There is no impacted cerumen.     Nose: Nose normal. No congestion or rhinorrhea.     Mouth/Throat:     Mouth: Mucous membranes are moist.     Pharynx: Oropharynx is clear. No oropharyngeal exudate or posterior oropharyngeal erythema.  Eyes:     General: No scleral icterus.       Right eye: No discharge.        Left eye: No discharge.     Extraocular Movements: Extraocular movements intact.     Conjunctiva/sclera: Conjunctivae normal.     Pupils: Pupils are equal, round, and reactive to light.  Neck:     Vascular: No carotid bruit.  Cardiovascular:     Rate and Rhythm: Normal rate and regular rhythm.     Pulses: Normal pulses.     Heart sounds: Normal heart sounds. No murmur heard.  No friction rub. No gallop.  Pulmonary:     Effort: Pulmonary effort is normal. No respiratory distress.     Breath sounds: Normal breath sounds. No stridor. No wheezing or rhonchi.  Chest:     Chest wall: No tenderness.     Comments: Deep scar in the lower inner quadrant of the right breast which is well healed in the right axilla. No masses in either breast.  Abdominal:     General: Bowel sounds are normal. There is no distension.     Palpations: Abdomen is soft. There is no mass.     Tenderness: There is no abdominal tenderness. There is no right CVA tenderness, left CVA tenderness, guarding or rebound.     Hernia: No hernia is present.  Musculoskeletal:        General: No swelling, tenderness, deformity or signs of injury. Normal  range of motion.     Cervical back: Normal range of motion and neck supple. No rigidity or tenderness.     Right lower leg: No edema.     Left lower leg: No edema.  Lymphadenopathy:     Cervical: No cervical adenopathy.  Skin:    General: Skin is warm and dry.     Coloration: Skin is not jaundiced or pale.     Findings: No bruising, erythema, lesion or rash.  Neurological:     General: No focal deficit present.     Mental Status: She is alert and oriented to person, place, and time. Mental status is at baseline.     Cranial Nerves: No cranial nerve deficit.     Sensory: No sensory deficit.     Motor: No weakness.     Coordination: Coordination normal.     Gait: Gait normal.     Deep Tendon Reflexes: Reflexes normal.  Psychiatric:        Mood and Affect: Mood normal.        Behavior: Behavior normal.        Thought Content: Thought content normal.        Judgment: Judgment normal.    LABS:      Latest Ref Rng & Units 10/07/2022    9:07 AM 04/06/2022   12:00 AM 10/07/2021   12:00 AM  CBC  WBC 4.0 - 10.5 K/uL 5.5  4.4     4.2   Hemoglobin 12.0 - 15.0 g/dL 12.3  12.8     13.2   Hematocrit 36.0 - 46.0 % 38.7  38     39   Platelets 150 - 400 K/uL 362  335     346      This result is from an external source.      Latest Ref Rng & Units 10/07/2022    9:07 AM 04/06/2022   12:00 AM 10/07/2021   12:00 AM  CMP  Glucose 70 - 99 mg/dL 106     BUN 8 - 23 mg/dL '17  14     17   '$ Creatinine 0.44 - 1.00 mg/dL 0.78  0.7     0.7   Sodium 135 - 145 mmol/L 134  134     136   Potassium 3.5 - 5.1 mmol/L 4.6  4.4     4.3   Chloride 98 - 111 mmol/L 98  99     102   CO2 22 - 32 mmol/L '26  28     27   '$ Calcium 8.9 - 10.3 mg/dL 9.0  9.3  8.9   Total Protein 6.5 - 8.1 g/dL 7.3     Total Bilirubin 0.3 - 1.2 mg/dL 0.7     Alkaline Phos 38 - 126 U/L 57  74     74   AST 15 - 41 U/L 23  36     34   ALT 0 - 44 U/L '14  21     20      '$ This result is from an external source.   STUDIES:  EXAM:  11/29/2021 BILATERAL BREAST MRI WITH AND WITHOUT CONTRAST  IMPRESSION: 1. No significant interval change in the 2 non biopsied enhancing masses in medial right breast, suspected to represent fat necrosis. No suspicious changes of the biopsied fat necrosis in the right breast. 2.  No evidence of left breast malignancy.  EXAM: 09/02/2021 DUAL X-RAY ABSORPTIOMETRY (DXA) FOR BONE MINERAL DENSITY   IMPRESSION:  Brianna Leonard completed a BMD test on 09/02/2021 using the [employer] (analysis version: 13.60) manufactured by [employer].  The following summarizes the results of our evaluation.  TECHNOLOGIST: Loman Chroman  PATIENT BIOGRAPHICAL:  Name: Brianna Leonard, Brianna Leonard  Patient ID: [id number] [facility] Birth Date: [birth year] Height: 64.0 in.  Gender: Female Exam Date: 09/02/2021 Weight: 124.0 lbs.  Indications: E28.39 Fractures: Treatments:   EXAM: 09/23/2021 DIGITAL DIAGNOSTIC UNILATERAL RIGHT MAMMOGRAM WITH TOMOSYNTHESIS AND  CAD  IMPRESSION:  Stable appearance of the right breast. No mammographic evidence  malignancy.   Allergies:  Allergies  Allergen Reactions   Rivaroxaban Rash   Sulfamethoxazole Rash    Current Medications: Current Outpatient Medications  Medication Sig Dispense Refill   Chromium 400 MCG TABS Take by mouth.     Cinnamon 500 MG TABS Take by mouth.     PREBIOTIC PRODUCT PO Take by mouth.     amLODipine (NORVASC) 5 MG tablet Take 5 mg by mouth daily.     ascorbic acid (VITAMIN C) 500 MG tablet Take by mouth.     aspirin 81 MG EC tablet Take by mouth.     Biotin 10000 MCG TABS Take by mouth.     Cholecalciferol (VITAMIN D3) 50 MCG (2000 UT) TABS Take by mouth.     melatonin 5 MG TABS Take 5 mg by mouth at bedtime.     Omega-3 1000 MG CAPS Take by mouth.     rosuvastatin (CRESTOR) 20 MG tablet Take by mouth.     THYROID PO Take by mouth. Thyroid Support Herbal Supplement     Turmeric 500 MG TABS Take 15,000 mg by mouth.     Zinc 25 MG TABS Take by mouth.     No  current facility-administered medications for this visit.     ASSESSMENT & PLAN:   Assessment:   1.  Stage IA triple negative right breast cancer, diagnosed in March 2018.  She remains without evidence of recurrence now 5 years postop. Repeat mammogram remains without evidence of disease.  2.  Biopsies of the right breast from March 2021 and July were consistent with fat necrosis at 1 o'clock and 12:30 o'clock. We will continue to monitor. She will have repeat bilateral mammograms in July and follow up with Dr. Noberto Retort.  3. Osteopenia with some worsening. She is already on calcium and vitamin D, this will be managed through her PCP.  Plan: She will return to Dr. Noberto Retort in July, 2024 for her annual bilateral mammogram. I will change her to yearly appointments. Her labs today are pending and Dr. Laqueta Due will do  her blood work. I will see her in 1 year for reevaluation. She verbalizes understanding of and agreement to the plans discussed today. She knows to call the office should any new questions or concerns arise.   I provided 15 minutes of face-to-face time during this this encounter and > 50% was spent counseling as documented under my assessment and plan.    Derwood Kaplan, MD [facility] [address] [place] [address] Dept: [phone] Dept Fax: [phone]     Sumner Boast Lassiter,acting as a scribe for Derwood Kaplan, MD.,have documented all relevant documentation on the behalf of Derwood Kaplan, MD,as directed by  Derwood Kaplan, MD while in the presence of Derwood Kaplan, MD.

## 2022-10-07 ENCOUNTER — Inpatient Hospital Stay: Payer: PPO | Attending: Oncology | Admitting: Oncology

## 2022-10-07 ENCOUNTER — Telehealth: Payer: Self-pay | Admitting: Oncology

## 2022-10-07 ENCOUNTER — Other Ambulatory Visit: Payer: Self-pay | Admitting: Oncology

## 2022-10-07 ENCOUNTER — Encounter: Payer: Self-pay | Admitting: Oncology

## 2022-10-07 ENCOUNTER — Inpatient Hospital Stay: Payer: PPO

## 2022-10-07 VITALS — BP 149/71 | HR 82 | Temp 98.1°F | Resp 15 | Ht 64.25 in | Wt 123.2 lb

## 2022-10-07 DIAGNOSIS — Z171 Estrogen receptor negative status [ER-]: Secondary | ICD-10-CM

## 2022-10-07 DIAGNOSIS — C50311 Malignant neoplasm of lower-inner quadrant of right female breast: Secondary | ICD-10-CM | POA: Diagnosis not present

## 2022-10-07 DIAGNOSIS — I2694 Multiple subsegmental pulmonary emboli without acute cor pulmonale: Secondary | ICD-10-CM

## 2022-10-07 DIAGNOSIS — Z853 Personal history of malignant neoplasm of breast: Secondary | ICD-10-CM | POA: Diagnosis not present

## 2022-10-07 DIAGNOSIS — M8580 Other specified disorders of bone density and structure, unspecified site: Secondary | ICD-10-CM | POA: Diagnosis not present

## 2022-10-07 DIAGNOSIS — Z923 Personal history of irradiation: Secondary | ICD-10-CM | POA: Diagnosis not present

## 2022-10-07 LAB — CBC WITH DIFFERENTIAL (CANCER CENTER ONLY)
Abs Immature Granulocytes: 0.01 10*3/uL (ref 0.00–0.07)
Basophils Absolute: 0.1 10*3/uL (ref 0.0–0.1)
Basophils Relative: 1 %
Eosinophils Absolute: 0.5 10*3/uL (ref 0.0–0.5)
Eosinophils Relative: 8 %
HCT: 38.7 % (ref 36.0–46.0)
Hemoglobin: 12.3 g/dL (ref 12.0–15.0)
Immature Granulocytes: 0 %
Lymphocytes Relative: 28 %
Lymphs Abs: 1.5 10*3/uL (ref 0.7–4.0)
MCH: 29.4 pg (ref 26.0–34.0)
MCHC: 31.8 g/dL (ref 30.0–36.0)
MCV: 92.4 fL (ref 80.0–100.0)
Monocytes Absolute: 0.4 10*3/uL (ref 0.1–1.0)
Monocytes Relative: 7 %
Neutro Abs: 3.1 10*3/uL (ref 1.7–7.7)
Neutrophils Relative %: 56 %
Platelet Count: 362 10*3/uL (ref 150–400)
RBC: 4.19 MIL/uL (ref 3.87–5.11)
RDW: 13.2 % (ref 11.5–15.5)
WBC Count: 5.5 10*3/uL (ref 4.0–10.5)
nRBC: 0 % (ref 0.0–0.2)

## 2022-10-07 LAB — CMP (CANCER CENTER ONLY)
ALT: 14 U/L (ref 0–44)
AST: 23 U/L (ref 15–41)
Albumin: 4.3 g/dL (ref 3.5–5.0)
Alkaline Phosphatase: 57 U/L (ref 38–126)
Anion gap: 10 (ref 5–15)
BUN: 17 mg/dL (ref 8–23)
CO2: 26 mmol/L (ref 22–32)
Calcium: 9 mg/dL (ref 8.9–10.3)
Chloride: 98 mmol/L (ref 98–111)
Creatinine: 0.78 mg/dL (ref 0.44–1.00)
GFR, Estimated: >60 mL/min (ref >60)
Glucose, Bld: 106 mg/dL — ABNORMAL HIGH (ref 70–99)
Potassium: 4.6 mmol/L (ref 3.5–5.1)
Sodium: 134 mmol/L — ABNORMAL LOW (ref 135–145)
Total Bilirubin: 0.7 mg/dL (ref 0.3–1.2)
Total Protein: 7.3 g/dL (ref 6.5–8.1)

## 2022-10-07 NOTE — Telephone Encounter (Signed)
Patient has been scheduled for follow-up visit per 10/07/22 LOS.  Pt given an appt calendar with date and time.

## 2022-10-12 ENCOUNTER — Telehealth: Payer: Self-pay

## 2022-10-12 NOTE — Telephone Encounter (Signed)
-----   Message from Derwood Kaplan, MD sent at 10/12/2022 12:22 PM EST ----- Regarding: call Tell her labs look good

## 2022-10-12 NOTE — Telephone Encounter (Signed)
Patient notified of lab results

## 2022-10-26 DIAGNOSIS — L814 Other melanin hyperpigmentation: Secondary | ICD-10-CM | POA: Diagnosis not present

## 2022-10-26 DIAGNOSIS — L578 Other skin changes due to chronic exposure to nonionizing radiation: Secondary | ICD-10-CM | POA: Diagnosis not present

## 2022-10-26 DIAGNOSIS — L300 Nummular dermatitis: Secondary | ICD-10-CM | POA: Diagnosis not present

## 2022-10-30 DIAGNOSIS — B9789 Other viral agents as the cause of diseases classified elsewhere: Secondary | ICD-10-CM | POA: Diagnosis not present

## 2022-10-30 DIAGNOSIS — M791 Myalgia, unspecified site: Secondary | ICD-10-CM | POA: Diagnosis not present

## 2022-10-30 DIAGNOSIS — R509 Fever, unspecified: Secondary | ICD-10-CM | POA: Diagnosis not present

## 2022-11-10 DIAGNOSIS — M1990 Unspecified osteoarthritis, unspecified site: Secondary | ICD-10-CM | POA: Diagnosis not present

## 2022-11-10 DIAGNOSIS — Z7982 Long term (current) use of aspirin: Secondary | ICD-10-CM | POA: Diagnosis not present

## 2022-11-10 DIAGNOSIS — M8580 Other specified disorders of bone density and structure, unspecified site: Secondary | ICD-10-CM | POA: Diagnosis not present

## 2022-11-10 DIAGNOSIS — E785 Hyperlipidemia, unspecified: Secondary | ICD-10-CM | POA: Diagnosis not present

## 2022-11-10 DIAGNOSIS — E039 Hypothyroidism, unspecified: Secondary | ICD-10-CM | POA: Diagnosis not present

## 2022-11-10 DIAGNOSIS — I10 Essential (primary) hypertension: Secondary | ICD-10-CM | POA: Diagnosis not present

## 2022-11-10 DIAGNOSIS — G4700 Insomnia, unspecified: Secondary | ICD-10-CM | POA: Diagnosis not present

## 2022-11-10 DIAGNOSIS — Z853 Personal history of malignant neoplasm of breast: Secondary | ICD-10-CM | POA: Diagnosis not present

## 2022-12-01 ENCOUNTER — Other Ambulatory Visit: Payer: Self-pay | Admitting: Vascular Surgery

## 2022-12-01 DIAGNOSIS — C50311 Malignant neoplasm of lower-inner quadrant of right female breast: Secondary | ICD-10-CM

## 2022-12-02 DIAGNOSIS — H25812 Combined forms of age-related cataract, left eye: Secondary | ICD-10-CM | POA: Diagnosis not present

## 2022-12-02 DIAGNOSIS — R350 Frequency of micturition: Secondary | ICD-10-CM | POA: Diagnosis not present

## 2022-12-02 DIAGNOSIS — R319 Hematuria, unspecified: Secondary | ICD-10-CM | POA: Diagnosis not present

## 2022-12-02 DIAGNOSIS — N3001 Acute cystitis with hematuria: Secondary | ICD-10-CM | POA: Diagnosis not present

## 2022-12-02 DIAGNOSIS — N3000 Acute cystitis without hematuria: Secondary | ICD-10-CM | POA: Diagnosis not present

## 2022-12-02 DIAGNOSIS — R300 Dysuria: Secondary | ICD-10-CM | POA: Diagnosis not present

## 2022-12-02 DIAGNOSIS — H524 Presbyopia: Secondary | ICD-10-CM | POA: Diagnosis not present

## 2022-12-21 DIAGNOSIS — C50311 Malignant neoplasm of lower-inner quadrant of right female breast: Secondary | ICD-10-CM | POA: Diagnosis not present

## 2022-12-21 DIAGNOSIS — R92323 Mammographic fibroglandular density, bilateral breasts: Secondary | ICD-10-CM | POA: Diagnosis not present

## 2023-01-03 DIAGNOSIS — N641 Fat necrosis of breast: Secondary | ICD-10-CM | POA: Diagnosis not present

## 2023-01-03 DIAGNOSIS — C50311 Malignant neoplasm of lower-inner quadrant of right female breast: Secondary | ICD-10-CM | POA: Diagnosis not present

## 2023-01-03 DIAGNOSIS — Z171 Estrogen receptor negative status [ER-]: Secondary | ICD-10-CM | POA: Diagnosis not present

## 2023-01-28 ENCOUNTER — Ambulatory Visit
Admission: RE | Admit: 2023-01-28 | Discharge: 2023-01-28 | Disposition: A | Discharge status: Home / Self Care | Payer: PPO | Source: Ambulatory Visit | Attending: Vascular Surgery | Admitting: Vascular Surgery

## 2023-01-28 DIAGNOSIS — Z853 Personal history of malignant neoplasm of breast: Secondary | ICD-10-CM | POA: Diagnosis not present

## 2023-01-28 DIAGNOSIS — R928 Other abnormal and inconclusive findings on diagnostic imaging of breast: Secondary | ICD-10-CM | POA: Diagnosis not present

## 2023-01-28 DIAGNOSIS — C50311 Malignant neoplasm of lower-inner quadrant of right female breast: Secondary | ICD-10-CM

## 2023-01-28 MED ORDER — GADOPICLENOL 0.5 MMOL/ML IV SOLN
6.0000 mL | Freq: Once | INTRAVENOUS | Status: AC | PRN
  Administered 2023-01-28: 6 mL via INTRAVENOUS

## 2023-02-22 DIAGNOSIS — B372 Candidiasis of skin and nail: Secondary | ICD-10-CM | POA: Diagnosis not present

## 2023-02-22 DIAGNOSIS — Z79899 Other long term (current) drug therapy: Secondary | ICD-10-CM | POA: Diagnosis not present

## 2023-02-22 DIAGNOSIS — E785 Hyperlipidemia, unspecified: Secondary | ICD-10-CM | POA: Diagnosis not present

## 2023-02-22 DIAGNOSIS — E039 Hypothyroidism, unspecified: Secondary | ICD-10-CM | POA: Diagnosis not present

## 2023-02-22 DIAGNOSIS — I10 Essential (primary) hypertension: Secondary | ICD-10-CM | POA: Diagnosis not present

## 2023-02-22 DIAGNOSIS — R7301 Impaired fasting glucose: Secondary | ICD-10-CM | POA: Diagnosis not present

## 2023-02-22 DIAGNOSIS — Z6820 Body mass index (BMI) 20.0-20.9, adult: Secondary | ICD-10-CM | POA: Diagnosis not present

## 2023-05-23 DIAGNOSIS — Z6820 Body mass index (BMI) 20.0-20.9, adult: Secondary | ICD-10-CM | POA: Diagnosis not present

## 2023-05-23 DIAGNOSIS — R0602 Shortness of breath: Secondary | ICD-10-CM | POA: Diagnosis not present

## 2023-05-23 DIAGNOSIS — R079 Chest pain, unspecified: Secondary | ICD-10-CM | POA: Diagnosis not present

## 2023-05-23 LAB — LAB REPORT - SCANNED

## 2023-05-24 ENCOUNTER — Encounter: Payer: Self-pay | Admitting: *Deleted

## 2023-05-24 DIAGNOSIS — M8580 Other specified disorders of bone density and structure, unspecified site: Secondary | ICD-10-CM | POA: Insufficient documentation

## 2023-05-24 DIAGNOSIS — E785 Hyperlipidemia, unspecified: Secondary | ICD-10-CM | POA: Insufficient documentation

## 2023-05-24 DIAGNOSIS — I700 Atherosclerosis of aorta: Secondary | ICD-10-CM | POA: Insufficient documentation

## 2023-05-24 DIAGNOSIS — I6782 Cerebral ischemia: Secondary | ICD-10-CM | POA: Insufficient documentation

## 2023-05-24 DIAGNOSIS — D329 Benign neoplasm of meninges, unspecified: Secondary | ICD-10-CM | POA: Insufficient documentation

## 2023-05-25 ENCOUNTER — Ambulatory Visit: Payer: PPO

## 2023-05-25 VITALS — BP 138/84 | HR 87 | Ht 64.6 in | Wt 123.4 lb

## 2023-05-25 DIAGNOSIS — R079 Chest pain, unspecified: Secondary | ICD-10-CM

## 2023-05-25 DIAGNOSIS — E785 Hyperlipidemia, unspecified: Secondary | ICD-10-CM

## 2023-05-25 DIAGNOSIS — D329 Benign neoplasm of meninges, unspecified: Secondary | ICD-10-CM | POA: Diagnosis not present

## 2023-05-25 HISTORY — DX: Chest pain, unspecified: R07.9

## 2023-05-25 NOTE — Assessment & Plan Note (Signed)
Last imaging October 2023 at [facility] with MRI of the brain reporting 1.1 cm probable left posterior frontal convexity meningioma without significant mass defect or adjacent edema.  No significant symptoms reported by her.  In the context of her ongoing chest pain symptoms, now being scheduled for coronary angiogram.   If felt to be high risk for revascularization, will defer to judgement of my interventional collegaues regarding any further imaging assessment.Marland Kitchen

## 2023-05-25 NOTE — Assessment & Plan Note (Addendum)
Progressive symptoms over the past 7 days associated with nonspecific EKG changes concerning for unstable angina.  Unlikely to be DVT/PE  Reviewed further options to evaluate for coronary artery disease.  Invasive versus noninvasive approach was discussed in the form of CT coronary angiogram versus cardiac catheterization. Given her acute symptoms and progressive nature, I discussed proceeding with cardiac catheterization.   Shared Decision Making/Informed Consent{ The risks [stroke (1 in 1000), death (1 in 1000), kidney failure [usually temporary] (1 in 500), bleeding (1 in 200), allergic reaction [possibly serious] (1 in 200)], benefits (diagnostic support and management of coronary artery disease) and alternatives of a cardiac catheterization were discussed in detail with her and her husband and she is willing to proceed.  Continue with aspirin 81 mg once daily, rosuvastatin 5 mg once daily.  Reviewed how to use sublingual nitroglycerin for symptoms.  For any persistent symptoms that do not resolve with sublingual nitroglycerin x 2 and with symptoms of pain at rest suggested she call 911 or get to the ER immediately but do not drive herself.

## 2023-05-25 NOTE — Progress Notes (Signed)
Cardiology Consultation:    Date:  05/25/2023   ID:  Brianna Leonard, [place] 01-30-1946, MRN [id number]  PCP:  Brianna Kingdom, MD  Cardiologist:  Brianna Corporal Britany Callicott, MD   Referring MD: Brianna Kingdom, MD   No chief complaint on file. Chest pain   ASSESSMENT AND PLAN:   [name] 77 year old woman with history of hypertension, hyperlipidemia, PE in June 2018 and continue anticoagulation with Xarelto for 3 months, breast cancer, meningioma [MRI of the brain October 2023 at [facility] with 1.1 cm left posterior frontal convexity meningioma] now presenting with progressive symptoms of chest pressure associated with exertion over the last 7 days.   Problem List Items Addressed This Visit     Hyperlipidemia - Primary    No recent lipid panel. Continue with Crestor 5 mg once daily.      Relevant Medications   nitroGLYCERIN (NITROSTAT) 0.4 MG SL tablet   Other Relevant Orders   EKG 12-Lead (Completed)   Meningioma [facility])    Last imaging October 2023 at [facility] with MRI of the brain reporting 1.1 cm probable left posterior frontal convexity meningioma without significant mass defect or adjacent edema.  No significant symptoms reported by her.  In the context of her ongoing chest pain symptoms, now being scheduled for coronary angiogram.   If felt to be high risk for revascularization, will defer to judgement of my interventional collegaues regarding any further imaging assessment..      Chest pain on exertion    Progressive symptoms over the past 7 days associated with nonspecific EKG changes concerning for unstable angina.  Unlikely to be DVT/PE  Reviewed further options to evaluate for coronary artery disease.  Invasive versus noninvasive approach was discussed in the form of CT coronary angiogram versus cardiac catheterization. Given her acute symptoms and progressive nature, I discussed proceeding with cardiac  catheterization.   Shared Decision Making/Informed Consent{ The risks [stroke (1 in 1000), death (1 in 1000), kidney failure [usually temporary] (1 in 500), bleeding (1 in 200), allergic reaction [possibly serious] (1 in 200)], benefits (diagnostic support and management of coronary artery disease) and alternatives of a cardiac catheterization were discussed in detail with her and her husband and she is willing to proceed.  Continue with aspirin 81 mg once daily, rosuvastatin 5 mg once daily.  Reviewed how to use sublingual nitroglycerin for symptoms.  For any persistent symptoms that do not resolve with sublingual nitroglycerin x 2 and with symptoms of pain at rest suggested she call 911 or get to the ER immediately but do not drive herself.        Relevant Orders   Basic metabolic panel   CBC   Return to clinic in 1 to 2 weeks post cath.   History of Present Illness:    Brianna Leonard is a 76 y.o. female who is being seen today for the evaluation of chest pain at the request of Brianna Kingdom, MD.   She has a history of hypertension, hyperlipidemia, remote PE in June 2018 [while she was on treatment for breast cancer, continued on Xarelto for 3 months before discontinuing the medication for her diffuse body rash], aortic and coronary artery atherosclerosis on prior imaging, CVA, meningioma [facility] October 2023 at [facility] with 1.1 cm left posterior frontal convexity meningioma without any mass effect or brain edema]], subclinical hypothyroidism, breast cancer   Very pleasant lady here for the visit by herself. Mentions she [facility] the  [facility].  Over the past week she has noticed increased symptoms of chest pressure and shortness of breath with exertion relieved with rest with less than her usual activity.  Over the last week she feels that there is progression in the intensity of the symptoms.  She was given prescription for sublingual nitroglycerin by  her PCP which she has not had to use yet as symptoms subside with rest.  No palpitations, lightheadedness, dizziness or syncopal episodes.  Occasional postural lightheadedness and suddenly changing position but is very momentary.  No pedal edema, orthopnea, paroxysmal nocturnal dyspnea.  EKG today sinus with HR 87/min and inferolateral ST depression and t wave changes suggestive of ischemia. Prior EKG from 05-24-2022 at [facility] was similar  Blood work ordered and completed on 05-23-2023 by PCP noted hemoglobin 13.1, hematocrit 38.7, WBC 4.8, platelets 323 Sodium 136, potassium 3.6, BUN 15, creatinine 0.7 Troponin I less than 0.01 NT proBNP 109 Normal transaminases and alkaline phosphatase   Past Medical History:  Diagnosis Date   Aortic atherosclerosis (HCC)    Breast cancer of lower-inner quadrant of right female breast (HCC) 11/04/2016   Hyperlipidemia    Hypertension    Hypothyroidism    Mild Clinical/ not requiring treatment   Ischemic cerebrovascular disease    Meningioma (HCC)    Osteopenia    Pulmonary embolism (HCC) 02/04/2017   Thyroid disease     Past Surgical History:  Procedure Laterality Date   BREAST LUMPECTOMY WITH NEEDLE LOCALIZATION AND AXILLARY SENTINEL LYMPH NODE BX Right    TUBAL LIGATION Bilateral 1980    Current Medications: Current Meds  Medication Sig   amLODipine (NORVASC) 5 MG tablet Take 5 mg by mouth daily.   Ascorbic Acid (VITAMIN C) 1000 MG tablet Take 1,000 mg by mouth daily.   ASHWAGANDHA PO Take 350 mg by mouth daily.   aspirin 81 MG EC tablet Take 81 mg by mouth daily.   Cholecalciferol (VITAMIN D3) 50 MCG (2000 UT) TABS Take 2,000 Units by mouth daily.   Cinnamon 500 MG TABS Take 500 mg by mouth daily.   Magnesium 200 MG TABS Take 200 mg by mouth daily.   melatonin 5 MG TABS Take 5 mg by mouth at bedtime.   Multiple Vitamins-Minerals (BONE SMART PO) Take 1 tablet by mouth daily after lunch. And 2 at supper   nitroGLYCERIN  (NITROSTAT) 0.4 MG SL tablet Place 0.4 mg under the tongue every 5 (five) minutes as needed for chest pain.   nystatin cream (MYCOSTATIN) Apply 1 Application topically 2 (two) times daily as needed for dry skin (Rash).   Omega-3 Fatty Acids (FISH OIL) 1000 MG CAPS Take 1,000 mg by mouth daily.   Probiotic Product (PROBIOTIC DAILY PO) Take 1 tablet by mouth daily.   rosuvastatin (CRESTOR) 20 MG tablet Take 5 mg by mouth daily.   THYROID PO Take 1 tablet by mouth daily. Thyroid Support Herbal Supplement   Turmeric (CURCUMIN 95) 500 MG CAPS Take 3 capsules by mouth daily.   Zinc 50 MG TABS Take 50 mg by mouth daily.     Allergies:   Rivaroxaban and Sulfamethoxazole   Social History   Socioeconomic History   Marital status: Married    Spouse name: Not on file   Number of children: 2   Years of education: Not on file   Highest education level: Not on file  Occupational History   Not on file  Tobacco Use   Smoking status: Former    Types:  Cigarettes   Smokeless tobacco: Never  Substance and Sexual Activity   Alcohol use: Never   Drug use: Never   Sexual activity: Not on file  Other Topics Concern   Not on file  Social History Narrative   Not on file   Social Determinants of Health   Financial Resource Strain: Not on file  Food Insecurity: Not on file  Transportation Needs: Not on file  Physical Activity: Not on file  Stress: Not on file  Social Connections: Not on file     Family History: The patient's family history includes Breast cancer (age of onset: 41) in her cousin; Cervical cancer (age of onset: 30) in her sister; Heart disease in her brother, brother, father, and mother; Non-Hodgkin's lymphoma (age of onset: 8) in her brother. ROS:   Please see the history of present illness.    All 14 point review of systems negative except as described per history of present illness.  EKGs/Labs/Other Studies Reviewed:    The following studies were reviewed today:   EKG:   EKG Interpretation Date/Time:  Thursday May 25 2023 13:29:44 EDT Ventricular Rate:  87 PR Interval:  126 QRS Duration:  84 QT Interval:  334 QTC Calculation: 401 R Axis:   58  Text Interpretation: Normal sinus rhythm ST & T wave abnormality, consider inferior ischemia Abnormal ECG No previous ECGs available Confirmed by Huntley Dec reddy [phone]) on 05/25/2023 2:10:29 PM    Recent Labs: 10/07/2022: ALT 14; BUN 17; Creatinine 0.78; Hemoglobin 12.3; Platelet Count 362; Potassium 4.6; Sodium 134  Recent Lipid Panel No results found for: "CHOL", "TRIG", "HDL", "CHOLHDL", "VLDL", "LDLCALC", "LDLDIRECT"  Physical Exam:    VS:  BP 138/84   Pulse 87   Ht 5' 4.6" (1.641 m)   Wt 123 lb 6.4 oz (56 kg)   SpO2 97%   BMI 20.79 kg/m     Wt Readings from Last 3 Encounters:  05/25/23 123 lb 6.4 oz (56 kg)  05/23/23 123 lb (55.8 kg)  10/07/22 123 lb 3.2 oz (55.9 kg)     GENERAL:  Well nourished, well developed in no acute distress NECK: No JVD; No carotid bruits CARDIAC: RRR, S1 and S2 present, no murmurs, no rubs, no gallops CHEST:  Clear to auscultation without rales, wheezing or rhonchi  Extremities: No pitting pedal edema. Pulses bilaterally symmetric with radial 2+ and dorsalis pedis 2+ NEUROLOGIC:  Alert and oriented x 3  Medication Adjustments/Labs and Tests Ordered: Current medicines are reviewed at length with the patient today.  Concerns regarding medicines are outlined above.  Orders Placed This Encounter  Procedures   Basic metabolic panel   CBC   EKG 12-Lead   No orders of the defined types were placed in this encounter.   Signed, Cecille Amsterdam, MD, MPH, [facility]. 05/25/2023 2:53 PM    [facility]

## 2023-05-25 NOTE — H&P (View-Only) (Signed)
Cardiology Consultation:    Date:  05/25/2023   ID:  Brianna Leonard, [place] 01-30-1946, MRN [id number]  PCP:  Brianna Kingdom, MD  Cardiologist:  Brianna Corporal Britany Callicott, MD   Referring MD: Brianna Kingdom, MD   No chief complaint on file. Chest pain   ASSESSMENT AND PLAN:   Brianna Leonard 77 year old woman with history of hypertension, hyperlipidemia, PE in June 2018 and continue anticoagulation with Xarelto for 3 months, breast cancer, meningioma [MRI of the brain October 2023 at [facility] with 1.1 cm left posterior frontal convexity meningioma] now presenting with progressive symptoms of chest pressure associated with exertion over the last 7 days.   Problem List Items Addressed This Visit     Hyperlipidemia - Primary    No recent lipid panel. Continue with Crestor 5 mg once daily.      Relevant Medications   nitroGLYCERIN (NITROSTAT) 0.4 MG SL tablet   Other Relevant Orders   EKG 12-Lead (Completed)   Meningioma [facility])    Last imaging October 2023 at [facility] with MRI of the brain reporting 1.1 cm probable left posterior frontal convexity meningioma without significant mass defect or adjacent edema.  No significant symptoms reported by her.  In the context of her ongoing chest pain symptoms, now being scheduled for coronary angiogram.   If felt to be high risk for revascularization, will defer to judgement of my interventional collegaues regarding any further imaging assessment..      Chest pain on exertion    Progressive symptoms over the past 7 days associated with nonspecific EKG changes concerning for unstable angina.  Unlikely to be DVT/PE  Reviewed further options to evaluate for coronary artery disease.  Invasive versus noninvasive approach was discussed in the form of CT coronary angiogram versus cardiac catheterization. Given her acute symptoms and progressive nature, I discussed proceeding with cardiac  catheterization.   Shared Decision Making/Informed Consent{ The risks [stroke (1 in 1000), death (1 in 1000), kidney failure [usually temporary] (1 in 500), bleeding (1 in 200), allergic reaction [possibly serious] (1 in 200)], benefits (diagnostic support and management of coronary artery disease) and alternatives of a cardiac catheterization were discussed in detail with her and her husband and she is willing to proceed.  Continue with aspirin 81 mg once daily, rosuvastatin 5 mg once daily.  Reviewed how to use sublingual nitroglycerin for symptoms.  For any persistent symptoms that do not resolve with sublingual nitroglycerin x 2 and with symptoms of pain at rest suggested she call 911 or get to the ER immediately but do not drive herself.        Relevant Orders   Basic metabolic panel   CBC   Return to clinic in 1 to 2 weeks post cath.   History of Present Illness:    Brianna Leonard is a 76 y.o. female who is being seen today for the evaluation of chest pain at the request of Brianna Kingdom, MD.   She has a history of hypertension, hyperlipidemia, remote PE in June 2018 [while she was on treatment for breast cancer, continued on Xarelto for 3 months before discontinuing the medication for her diffuse body rash], aortic and coronary artery atherosclerosis on prior imaging, CVA, meningioma [facility] October 2023 at [facility] with 1.1 cm left posterior frontal convexity meningioma without any mass effect or brain edema]], subclinical hypothyroidism, breast cancer   Very pleasant lady here for the visit by herself. Mentions she exercises regularly at the  [facility].  Over the past week she has noticed increased symptoms of chest pressure and shortness of breath with exertion relieved with rest with less than her usual activity.  Over the last week she feels that there is progression in the intensity of the symptoms.  She was given prescription for sublingual nitroglycerin by  her PCP which she has not had to use yet as symptoms subside with rest.  No palpitations, lightheadedness, dizziness or syncopal episodes.  Occasional postural lightheadedness and suddenly changing position but is very momentary.  No pedal edema, orthopnea, paroxysmal nocturnal dyspnea.  EKG today sinus with HR 87/min and inferolateral ST depression and t wave changes suggestive of ischemia. Prior EKG from 05-24-2022 at [facility] was similar  Blood work ordered and completed on 05-23-2023 by PCP noted hemoglobin 13.1, hematocrit 38.7, WBC 4.8, platelets 323 Sodium 136, potassium 3.6, BUN 15, creatinine 0.7 Troponin I less than 0.01 NT proBNP 109 Normal transaminases and alkaline phosphatase   Past Medical History:  Diagnosis Date   Aortic atherosclerosis (HCC)    Breast cancer of lower-inner quadrant of right female breast (HCC) 11/04/2016   Hyperlipidemia    Hypertension    Hypothyroidism    Mild Clinical/ not requiring treatment   Ischemic cerebrovascular disease    Meningioma (HCC)    Osteopenia    Pulmonary embolism (HCC) 02/04/2017   Thyroid disease     Past Surgical History:  Procedure Laterality Date   BREAST LUMPECTOMY WITH NEEDLE LOCALIZATION AND AXILLARY SENTINEL LYMPH NODE BX Right    TUBAL LIGATION Bilateral 1980    Current Medications: Current Meds  Medication Sig   amLODipine (NORVASC) 5 MG tablet Take 5 mg by mouth daily.   Ascorbic Acid (VITAMIN C) 1000 MG tablet Take 1,000 mg by mouth daily.   ASHWAGANDHA PO Take 350 mg by mouth daily.   aspirin 81 MG EC tablet Take 81 mg by mouth daily.   Cholecalciferol (VITAMIN D3) 50 MCG (2000 UT) TABS Take 2,000 Units by mouth daily.   Cinnamon 500 MG TABS Take 500 mg by mouth daily.   Magnesium 200 MG TABS Take 200 mg by mouth daily.   melatonin 5 MG TABS Take 5 mg by mouth at bedtime.   Multiple Vitamins-Minerals (BONE SMART PO) Take 1 tablet by mouth daily after lunch. And 2 at supper   nitroGLYCERIN  (NITROSTAT) 0.4 MG SL tablet Place 0.4 mg under the tongue every 5 (five) minutes as needed for chest pain.   nystatin cream (MYCOSTATIN) Apply 1 Application topically 2 (two) times daily as needed for dry skin (Rash).   Omega-3 Fatty Acids (FISH OIL) 1000 MG CAPS Take 1,000 mg by mouth daily.   Probiotic Product (PROBIOTIC DAILY PO) Take 1 tablet by mouth daily.   rosuvastatin (CRESTOR) 20 MG tablet Take 5 mg by mouth daily.   THYROID PO Take 1 tablet by mouth daily. Thyroid Support Herbal Supplement   Turmeric (CURCUMIN 95) 500 MG CAPS Take 3 capsules by mouth daily.   Zinc 50 MG TABS Take 50 mg by mouth daily.     Allergies:   Rivaroxaban and Sulfamethoxazole   Social History   Socioeconomic History   Marital status: Married    Spouse name: Not on file   Number of children: 2   Years of education: Not on file   Highest education level: Not on file  Occupational History   Not on file  Tobacco Use   Smoking status: Former    Types:  Cigarettes   Smokeless tobacco: Never  Substance and Sexual Activity   Alcohol use: Never   Drug use: Never   Sexual activity: Not on file  Other Topics Concern   Not on file  Social History Narrative   Not on file   Social Determinants of Health   Financial Resource Strain: Not on file  Food Insecurity: Not on file  Transportation Needs: Not on file  Physical Activity: Not on file  Stress: Not on file  Social Connections: Not on file     Family History: The patient's family history includes Breast cancer (age of onset: 41) in her cousin; Cervical cancer (age of onset: 30) in her sister; Heart disease in her brother, brother, father, and mother; Non-Hodgkin's lymphoma (age of onset: 8) in her brother. ROS:   Please see the history of present illness.    All 14 point review of systems negative except as described per history of present illness.  EKGs/Labs/Other Studies Reviewed:    The following studies were reviewed today:   EKG:   EKG Interpretation Date/Time:  Thursday May 25 2023 13:29:44 EDT Ventricular Rate:  87 PR Interval:  126 QRS Duration:  84 QT Interval:  334 QTC Calculation: 401 R Axis:   58  Text Interpretation: Normal sinus rhythm ST & T wave abnormality, consider inferior ischemia Abnormal ECG No previous ECGs available Confirmed by Huntley Dec reddy [phone]) on 05/25/2023 2:10:29 PM    Recent Labs: 10/07/2022: ALT 14; BUN 17; Creatinine 0.78; Hemoglobin 12.3; Platelet Count 362; Potassium 4.6; Sodium 134  Recent Lipid Panel No results found for: "CHOL", "TRIG", "HDL", "CHOLHDL", "VLDL", "LDLCALC", "LDLDIRECT"  Physical Exam:    VS:  BP 138/84   Pulse 87   Ht 5' 4.6" (1.641 m)   Wt 123 lb 6.4 oz (56 kg)   SpO2 97%   BMI 20.79 kg/m     Wt Readings from Last 3 Encounters:  05/25/23 123 lb 6.4 oz (56 kg)  05/23/23 123 lb (55.8 kg)  10/07/22 123 lb 3.2 oz (55.9 kg)     GENERAL:  Well nourished, well developed in no acute distress NECK: No JVD; No carotid bruits CARDIAC: RRR, S1 and S2 present, no murmurs, no rubs, no gallops CHEST:  Clear to auscultation without rales, wheezing or rhonchi  Extremities: No pitting pedal edema. Pulses bilaterally symmetric with radial 2+ and dorsalis pedis 2+ NEUROLOGIC:  Alert and oriented x 3  Medication Adjustments/Labs and Tests Ordered: Current medicines are reviewed at length with the patient today.  Concerns regarding medicines are outlined above.  Orders Placed This Encounter  Procedures   Basic metabolic panel   CBC   EKG 12-Lead   No orders of the defined types were placed in this encounter.   Signed, Cecille Amsterdam, MD, MPH, [facility]. 05/25/2023 2:53 PM    [facility]

## 2023-05-25 NOTE — Assessment & Plan Note (Signed)
No recent lipid panel. Continue with Crestor 5 mg once daily.

## 2023-05-25 NOTE — Patient Instructions (Signed)
Medication Instructions:  Your physician recommends that you continue on your current medications as directed. Please refer to the Current Medication list given to you today.  *If you need a refill on your cardiac medications before your next appointment, please call your pharmacy*   Lab Work: BMET, CBC - Today   If you have labs (blood work) drawn today and your tests are completely normal, you will receive your results only by: MyChart Message (if you have MyChart) OR A paper copy in the mail If you have any lab test that is abnormal or we need to change your treatment, we will call you to review the results.   Testing/Procedures: Your physician has requested that you have a cardiac catheterization. Cardiac catheterization is used to diagnose and/or treat various heart conditions. Doctors may recommend this procedure for a number of different reasons. The most common reason is to evaluate chest pain. Chest pain can be a symptom of coronary artery disease (CAD), and cardiac catheterization can show whether plaque is narrowing or blocking your heart's arteries. This procedure is also used to evaluate the valves, as well as measure the blood flow and oxygen levels in different parts of your heart. For further information please visit [email or web address] Please follow instruction sheet, as given.   Follow-Up: At [facility], you and your health needs are our priority.  As part of our continuing mission to provide you with exceptional heart care, we have created designated Provider Care Teams.  These Care Teams include your primary Cardiologist (physician) and Advanced Practice Providers (APPs -  Physician Assistants and Nurse Practitioners) who all work together to provide you with the care you need, when you need it.  We recommend signing up for the patient portal called "MyChart".  Sign up information is provided on this After Visit Summary.  MyChart is used to connect with patients  for Virtual Visits (Telemedicine).  Patients are able to view lab/test results, encounter notes, upcoming appointments, etc.  Non-urgent messages can be sent to your provider as well.   To learn more about what you can do with MyChart, go to [email or web address].    Your next appointment:   2 week(s)  Provider:   Huntley Dec, MD    Other Instructions  [facility] DEPT [facility] AT  [address] [place] [address] Dept: [phone] Loc: [phone]  Brianna Leonard  05/25/2023  You are scheduled for a Cardiac Catheterization on Friday, October 11 with Dr.  Yates Decamp .  1. Please arrive at the [facility] (Main Entrance A) at [facility]: [address] [place], [place] [address] at 10:00 AM (This time is 2 hour(s) before your procedure to ensure your preparation). Free valet parking service is available. You will check in at ADMITTING. The support person will be asked to wait in the waiting room.  It is OK to have someone drop you off and come back when you are ready to be discharged.    Special note: Every effort is made to have your procedure done on time. Please understand that emergencies sometimes delay scheduled procedures.  2. Diet: Do not eat solid foods after midnight.  The patient may have clear liquids until 5am upon the day of the procedure.  3. Medication instructions in preparation for your procedure:    On the morning of your procedure, take your Aspirin 81 mg and any morning medicines NOT listed above.  You may use sips of water.  5. Plan to go home the same day, you will only stay overnight if medically necessary. 6. Bring a current list of your medications and current insurance cards. 7. You MUST have a responsible person to drive you home. 8. Someone MUST be with you the first 24 hours after you arrive home or your discharge will be delayed. 9. Please wear clothes  that are easy to get on and off and wear slip-on shoes.  Thank you for allowing [place] to care for you!   -- [facility]

## 2023-05-26 ENCOUNTER — Other Ambulatory Visit: Payer: Self-pay

## 2023-05-26 ENCOUNTER — Encounter (HOSPITAL_COMMUNITY): Admission: RE | Disposition: A | Discharge status: Home / Self Care | Payer: Self-pay | Source: Home / Self Care

## 2023-05-26 ENCOUNTER — Ambulatory Visit (HOSPITAL_COMMUNITY)
Admission: RE | Admit: 2023-05-26 | Discharge: 2023-05-26 | Disposition: A | Discharge status: Home / Self Care | Payer: PPO

## 2023-05-26 ENCOUNTER — Other Ambulatory Visit (HOSPITAL_COMMUNITY): Payer: Self-pay

## 2023-05-26 DIAGNOSIS — E785 Hyperlipidemia, unspecified: Secondary | ICD-10-CM | POA: Insufficient documentation

## 2023-05-26 DIAGNOSIS — Z955 Presence of coronary angioplasty implant and graft: Secondary | ICD-10-CM | POA: Diagnosis not present

## 2023-05-26 DIAGNOSIS — I10 Essential (primary) hypertension: Secondary | ICD-10-CM | POA: Insufficient documentation

## 2023-05-26 DIAGNOSIS — Z87891 Personal history of nicotine dependence: Secondary | ICD-10-CM | POA: Diagnosis not present

## 2023-05-26 DIAGNOSIS — Z79899 Other long term (current) drug therapy: Secondary | ICD-10-CM | POA: Insufficient documentation

## 2023-05-26 DIAGNOSIS — I25110 Atherosclerotic heart disease of native coronary artery with unstable angina pectoris: Secondary | ICD-10-CM | POA: Diagnosis not present

## 2023-05-26 DIAGNOSIS — I2510 Atherosclerotic heart disease of native coronary artery without angina pectoris: Secondary | ICD-10-CM | POA: Insufficient documentation

## 2023-05-26 DIAGNOSIS — Z853 Personal history of malignant neoplasm of breast: Secondary | ICD-10-CM | POA: Insufficient documentation

## 2023-05-26 DIAGNOSIS — Z86711 Personal history of pulmonary embolism: Secondary | ICD-10-CM | POA: Insufficient documentation

## 2023-05-26 DIAGNOSIS — R079 Chest pain, unspecified: Secondary | ICD-10-CM | POA: Diagnosis present

## 2023-05-26 HISTORY — PX: CORONARY STENT INTERVENTION: CATH118234

## 2023-05-26 HISTORY — DX: Atherosclerotic heart disease of native coronary artery without angina pectoris: I25.10

## 2023-05-26 HISTORY — PX: LEFT HEART CATH AND CORONARY ANGIOGRAPHY: CATH118249

## 2023-05-26 LAB — CBC
Hematocrit: 40.6 % (ref 34.0–46.6)
Hemoglobin: 13.5 g/dL (ref 11.1–15.9)
MCH: 30.1 pg (ref 26.6–33.0)
MCHC: 33.3 g/dL (ref 31.5–35.7)
MCV: 91 fL (ref 79–97)
Platelets: 364 10*3/uL (ref 150–450)
RBC: 4.48 x10E6/uL (ref 3.77–5.28)
RDW: 13.1 % (ref 11.7–15.4)
WBC: 4.8 10*3/uL (ref 3.4–10.8)

## 2023-05-26 LAB — BASIC METABOLIC PANEL
BUN/Creatinine Ratio: 17 (ref 12–28)
BUN: 12 mg/dL (ref 8–27)
CO2: 22 mmol/L (ref 20–29)
Calcium: 9.9 mg/dL (ref 8.7–10.3)
Chloride: 100 mmol/L (ref 96–106)
Creatinine, Ser: 0.69 mg/dL (ref 0.57–1.00)
Glucose: 103 mg/dL — ABNORMAL HIGH (ref 70–99)
Potassium: 4.2 mmol/L (ref 3.5–5.2)
Sodium: 141 mmol/L (ref 134–144)
eGFR: 90 mL/min/{1.73_m2} (ref >59)

## 2023-05-26 SURGERY — LEFT HEART CATH AND CORONARY ANGIOGRAPHY
Anesthesia: LOCAL

## 2023-05-26 MED ORDER — VERAPAMIL HCL 2.5 MG/ML IV SOLN
INTRAVENOUS | Status: DC | PRN
  Administered 2023-05-26: 10 mL via INTRA_ARTERIAL

## 2023-05-26 MED ORDER — CLOPIDOGREL BISULFATE 75 MG PO TABS
75.0000 mg | ORAL_TABLET | Freq: Every day | ORAL | 1 refills | Status: DC
  Filled 2023-05-26: qty 90, 90d supply, fill #0

## 2023-05-26 MED ORDER — ASPIRIN 81 MG PO TBEC
81.0000 mg | DELAYED_RELEASE_TABLET | Freq: Every day | ORAL | 0 refills | Status: AC
  Filled 2023-05-26: qty 30, 30d supply, fill #0

## 2023-05-26 MED ORDER — SODIUM CHLORIDE 0.9 % IV SOLN: 250.0000 mL | INTRAVENOUS | Status: DC | PRN

## 2023-05-26 MED ORDER — NITROGLYCERIN 1 MG/10 ML FOR IR/CATH LAB
INTRA_ARTERIAL | Status: AC
  Filled 2023-05-26: qty 10

## 2023-05-26 MED ORDER — HEPARIN SODIUM (PORCINE) 1000 UNIT/ML IJ SOLN
INTRAMUSCULAR | Status: AC
  Filled 2023-05-26: qty 10

## 2023-05-26 MED ORDER — HYDRALAZINE HCL 20 MG/ML IJ SOLN: 5.0000 mg | INTRAMUSCULAR | Status: DC | PRN

## 2023-05-26 MED ORDER — SODIUM CHLORIDE 0.9% FLUSH: 3.0000 mL | INTRAVENOUS | Status: DC | PRN

## 2023-05-26 MED ORDER — SODIUM CHLORIDE 0.9% FLUSH: 3.0000 mL | Freq: Two times a day (BID) | INTRAVENOUS | Status: DC

## 2023-05-26 MED ORDER — HEPARIN SODIUM (PORCINE) 1000 UNIT/ML IJ SOLN
INTRAMUSCULAR | Status: DC | PRN
  Administered 2023-05-26 (×2): 5000 [IU] via INTRAVENOUS

## 2023-05-26 MED ORDER — FENTANYL CITRATE (PF) 100 MCG/2ML IJ SOLN
INTRAMUSCULAR | Status: AC
  Filled 2023-05-26: qty 2

## 2023-05-26 MED ORDER — ONDANSETRON HCL 4 MG/2ML IJ SOLN: 4.0000 mg | Freq: Four times a day (QID) | INTRAMUSCULAR | Status: DC | PRN

## 2023-05-26 MED ORDER — LABETALOL HCL 5 MG/ML IV SOLN: 10.0000 mg | INTRAVENOUS | Status: DC | PRN

## 2023-05-26 MED ORDER — FENTANYL CITRATE (PF) 100 MCG/2ML IJ SOLN
INTRAMUSCULAR | Status: DC | PRN
  Administered 2023-05-26: 25 ug via INTRAVENOUS

## 2023-05-26 MED ORDER — VERAPAMIL HCL 2.5 MG/ML IV SOLN
INTRAVENOUS | Status: AC
  Filled 2023-05-26: qty 2

## 2023-05-26 MED ORDER — MIDAZOLAM HCL 2 MG/2ML IJ SOLN
INTRAMUSCULAR | Status: AC
  Filled 2023-05-26: qty 2

## 2023-05-26 MED ORDER — CLOPIDOGREL BISULFATE 300 MG PO TABS
ORAL_TABLET | ORAL | Status: DC | PRN
  Administered 2023-05-26: 600 mg via ORAL

## 2023-05-26 MED ORDER — CLOPIDOGREL BISULFATE 300 MG PO TABS
ORAL_TABLET | ORAL | Status: AC
  Filled 2023-05-26: qty 2

## 2023-05-26 MED ORDER — SODIUM CHLORIDE 0.9 % WEIGHT BASED INFUSION
3.0000 mL/kg/h | INTRAVENOUS | Status: AC
  Administered 2023-05-26: 3 mL/kg/h via INTRAVENOUS

## 2023-05-26 MED ORDER — MIDAZOLAM HCL 2 MG/2ML IJ SOLN
INTRAMUSCULAR | Status: DC | PRN
  Administered 2023-05-26: 2 mg via INTRAVENOUS

## 2023-05-26 MED ORDER — NITROGLYCERIN 1 MG/10 ML FOR IR/CATH LAB
INTRA_ARTERIAL | Status: DC | PRN
  Administered 2023-05-26: 200 ug via INTRACORONARY

## 2023-05-26 MED ORDER — ACETAMINOPHEN 325 MG PO TABS: 650.0000 mg | ORAL_TABLET | ORAL | Status: DC | PRN

## 2023-05-26 MED ORDER — LIDOCAINE HCL (PF) 1 % IJ SOLN
INTRAMUSCULAR | Status: DC | PRN
  Administered 2023-05-26: 2 mL

## 2023-05-26 MED ORDER — ROSUVASTATIN CALCIUM 20 MG PO TABS
20.0000 mg | ORAL_TABLET | Freq: Every day | ORAL | 1 refills | Status: DC
  Filled 2023-05-26: qty 90, 90d supply, fill #0

## 2023-05-26 MED ORDER — IOHEXOL 350 MG/ML SOLN
INTRAVENOUS | Status: DC | PRN
  Administered 2023-05-26: 65 mL via INTRA_ARTERIAL

## 2023-05-26 MED ORDER — SODIUM CHLORIDE 0.9 % WEIGHT BASED INFUSION: 1.0000 mL/kg/h | INTRAVENOUS | Status: DC

## 2023-05-26 MED ORDER — HEPARIN (PORCINE) IN NACL 1000-0.9 UT/500ML-% IV SOLN
INTRAVENOUS | Status: DC | PRN
  Administered 2023-05-26: 1000 mL via INTRA_ARTERIAL

## 2023-05-26 MED ORDER — LIDOCAINE HCL (PF) 1 % IJ SOLN
INTRAMUSCULAR | Status: AC
  Filled 2023-05-26: qty 30

## 2023-05-26 MED ORDER — CLOPIDOGREL BISULFATE 75 MG PO TABS
75.0000 mg | ORAL_TABLET | Freq: Every day | ORAL | 0 refills | Status: DC
  Filled 2023-05-26: qty 30, 30d supply, fill #0

## 2023-05-26 SURGICAL SUPPLY — 16 items
BALLN EMERGE MR 3.5X12 (BALLOONS) ×1
BALLOON EMERGE MR 3.5X12 (BALLOONS) IMPLANT
CATH INFINITI AMBI 5FR TG (CATHETERS) IMPLANT
CATH LAUNCHER 6FR JR4 (CATHETERS) IMPLANT
DEVICE RAD COMP TR BAND LRG (VASCULAR PRODUCTS) IMPLANT
GLIDESHEATH SLEND A-KIT 6F 22G (SHEATH) IMPLANT
GUIDEWIRE INQWIRE 1.5J.035X260 (WIRE) IMPLANT
INQWIRE 1.5J .035X260CM (WIRE) ×1
KIT ENCORE 26 ADVANTAGE (KITS) IMPLANT
PACK CARDIAC CATHETERIZATION (CUSTOM PROCEDURE TRAY) ×1 IMPLANT
SET ATX-X65L (MISCELLANEOUS) IMPLANT
SHEATH PROBE COVER 6X72 (BAG) IMPLANT
STENT SYNERGY XD 3.50X20 (Permanent Stent) IMPLANT
SYNERGY XD 3.50X20 (Permanent Stent) ×1 IMPLANT
TUBING CIL FLEX 10 FLL-RA (TUBING) IMPLANT
WIRE RUNTHROUGH .014X180CM (WIRE) IMPLANT

## 2023-05-26 NOTE — Progress Notes (Signed)
Discussed with pt and husband stent, restrictions, Plavix importance, diet, exercise, NTG and CRPII. Pt is already exercising daily and does watch her diet. Will refer to [facility]. [phone] Ethelda Chick BS, ACSM-CEP 05/26/2023 3:43 PM

## 2023-05-26 NOTE — Progress Notes (Signed)
Patient noticed to have a small amount of oozing from right radial gauze dressing. Pressure held. Dressing reinforced with another gauze and tegaderm. Patient education restated. Patient understanding noted.

## 2023-05-26 NOTE — Interval H&P Note (Signed)
History and Physical Interval Note:  05/26/2023 2:10 PM  Brianna Leonard  has presented today for surgery, with the diagnosis of unstable angina.  The various methods of treatment have been discussed with the patient and family. After consideration of risks, benefits and other options for treatment, the patient has consented to  Procedure(s): LEFT HEART CATH AND CORONARY ANGIOGRAPHY (N/A) and coronary intervention as a surgical intervention.  The patient's history has been reviewed, patient examined, no change in status, stable for surgery.  I have reviewed the patient's chart and labs.  Questions were answered to the patient's satisfaction.    Patient with new onset angina pectoris, discussed with the patient, she has intermediate coronary syndrome, even having chest pain during given minimal activities and sometimes at rest as well associated with shortness of breath, hence would be clinically high risk unstable angina presentation.  Will proceed with left heart catheterization and possible coronary angioplasty.  All questions have been answered.  Cath Lab Visit (complete for each Cath Lab visit)  Clinical Evaluation Leading to the Procedure:   ACS: Yes.    Non-ACS:    Anginal Classification: CCS IV  Anti-ischemic medical therapy: Minimal Therapy (1 class of medications)  Non-Invasive Test Results: No non-invasive testing performed  Prior CABG: No previous CABG   Yates Decamp

## 2023-05-26 NOTE — Progress Notes (Signed)
Left Heart Catheterization 05/26/23:     3.5 x 20 mm Synergy XD DES and mid RCA.  Left coronary minimal disease.  Normal LVEF.

## 2023-05-26 NOTE — Discharge Instructions (Signed)

## 2023-05-26 NOTE — Discharge Summary (Signed)
Discharge Summary for Same Day PCI   Patient ID: Brianna Leonard MRN: [id number]; DOB: [birth year]  Admit date: 05/26/2023 Discharge date: 05/26/2023  Primary Care Provider: Philemon Kingdom, MD  Primary Cardiologist: Marlyn Corporal Madireddy, MD  Primary Electrophysiologist:  None   Discharge Diagnoses    Principal Problem:   Chest pain on exertion Active Problems:   Coronary artery disease  Diagnostic Studies/Procedures    Cardiac Catheterization 05/26/2023:  Hemodynamic data: LV: 162/6, EDP 8 mmHg.  Ao 149/88, mean 117 mmHg.  No pressure gradient across the aortic valve. Angiographic data: LM: Ostial 20 to 30% stenosis. LAD: Large vessel, tortuous in the mid to distal segment suggestive of hypertensive heart disease.  Has mild 20% stenosis in the midsegment.  Mild diffuse calcification from the proximal and mid segment noted.  Gives origin to moderate size D1 with mild disease in the ostium. LCx: Large vessel, giving origin to large OM1, very tortuous vessel again suggestive of hypertensive heart disease.  Mild disease is noted in the marginal.  OM 2 and 3 are small. RCA: Large-caliber vessel.  Mild diffuse disease is noted.  Mid segment has a focal 95 to 99% stenosis.   Intervention data: Successful PTCA and stenting of the mid RCA with implantation of a 3.5 x 20 mm Synergy XD DES, stenosis reduced from 95% to 0% with TIMI III to TIMI-3 flow.      Recommendation: DAPT with aspirin indefinitely and Plavix for 6 months.  Will be discharged home with outpatient follow-up.   _____________   History of Present Illness     Brianna Leonard is a 77 y.o. female with PMH of HTN, HLD, hx of PE, Breast CA, meningioma who presented to the office on 10/10 and seen by Dr. Vincent Gros for chest pain. Reported progressive chest pain over the past 7 days prior to office visit. EKG showed non-specific changes. [facility]  Over the past week  prior to her office visit she has noticed increased symptoms of chest pressure and shortness of breath with exertion relieved with rest with less than her usual activity.  Over the that last week she felt that there was progression in the intensity of the symptoms.  She was given prescription for sublingual nitroglycerin by her PCP which she has not had to use yet as symptoms subside with rest. Given the progression of her symptoms, it was recommended that she undergo outpatient cardiac cath.    Hospital Course     The patient underwent cardiac cath as noted above with 95% lesion in the mRCA treated wit PCI/DES x1. Plan for DAPT with ASA/plavix for at least 6 months. The patient was seen by cardiac rehab while in short stay. There were no observed complications post cath. Radial cath site was re-evaluated prior to discharge and found to be stable without any complications. Instructions/precautions regarding cath site care were given prior to discharge.  Brianna Leonard was seen by Dr. Jacinto Halim and determined stable for discharge home. Follow up with our office has been arranged. Medications are listed below. Pertinent changes include plavix, increased Crestor from 5 to 20mg  daily.    _____________  Cath/PCI Registry Performance & Quality Measures: Aspirin prescribed? - Yes ADP Receptor Inhibitor (Plavix/Clopidogrel, Brilinta/Ticagrelor or Effient/Prasugrel) prescribed (includes medically managed patients)? - Yes High Intensity Statin (Lipitor 40-80mg  or Crestor 20-40mg ) prescribed? - Yes For EF <40%, was ACEI/ARB prescribed? - Not Applicable (EF >/= 40%) For EF <40%,  Aldosterone Antagonist (Spironolactone or Eplerenone) prescribed? - Not Applicable (EF >/= 40%) Cardiac Rehab Phase II ordered (Included Medically managed Patients)? - Yes  _____________   Discharge Vitals Blood pressure 126/71, pulse 81, temperature 97.8 F (36.6 C), temperature source Temporal, resp. rate (!) 30, height 5'  4.5" (1.638 m), weight 54.4 kg, SpO2 97%.  Filed Weights   05/26/23 1034  Weight: 54.4 kg    Last Labs & Radiologic Studies    CBC Recent Labs    05/25/23 1502  WBC 4.8  HGB 13.5  HCT 40.6  MCV 91  PLT 364   Basic Metabolic Panel Recent Labs    16/10/96 1502  NA 141  K 4.2  CL 100  CO2 22  GLUCOSE 103*  BUN 12  CREATININE 0.69  CALCIUM 9.9   Liver Function Tests No results for input(s): "AST", "ALT", "ALKPHOS", "BILITOT", "PROT", "ALBUMIN" in the last 72 hours. No results for input(s): "LIPASE", "AMYLASE" in the last 72 hours. High Sensitivity Troponin:   No results for input(s): "TROPONINIHS" in the last 720 hours.  BNP Invalid input(s): "POCBNP" D-Dimer No results for input(s): "DDIMER" in the last 72 hours. Hemoglobin A1C No results for input(s): "HGBA1C" in the last 72 hours. Fasting Lipid Panel No results for input(s): "CHOL", "HDL", "LDLCALC", "TRIG", "CHOLHDL", "LDLDIRECT" in the last 72 hours. Thyroid Function Tests No results for input(s): "TSH", "T4TOTAL", "T3FREE", "THYROIDAB" in the last 72 hours.  Invalid input(s): "FREET3" _____________  CARDIAC CATHETERIZATION  Result Date: 05/26/2023 Images from the original result were not included. Left Heart Catheterization 05/26/23: Hemodynamic data: LV: 162/6, EDP 8 mmHg.  Ao 149/88, mean 117 mmHg.  No pressure gradient across the aortic valve. Angiographic data: LM: Ostial 20 to 30% stenosis. LAD: Large vessel, tortuous in the mid to distal segment suggestive of hypertensive heart disease.  Has mild 20% stenosis in the midsegment.  Mild diffuse calcification from the proximal and mid segment noted.  Gives origin to moderate size D1 with mild disease in the ostium. LCx: Large vessel, giving origin to large OM1, very tortuous vessel again suggestive of hypertensive heart disease.  Mild disease is noted in the marginal.  OM 2 and 3 are small. RCA: Large-caliber vessel.  Mild diffuse disease is noted.  Mid segment  has a focal 95 to 99% stenosis. Intervention data: Successful PTCA and stenting of the mid RCA with implantation of a 3.5 x 20 mm Synergy XD DES, stenosis reduced from 95% to 0% with TIMI III to TIMI-3 flow. Recommendation: DAPT with aspirin indefinitely and Plavix for 6 months.  Will be discharged home with outpatient follow-up.    Disposition   Pt is being discharged home today in good condition.  Follow-up Plans & Appointments     Follow-up Information     Madireddy, Marlyn Corporal, MD Follow up on 06/08/2023.   Specialty: Cardiology Why: at 1:20pm for your follow up appt with cardiology Contact information: [address] [place] [address] [phone]                Discharge Instructions     Amb Referral to Cardiac Rehabilitation   Complete by: As directed    To [facility]   Diagnosis:  Coronary Stents PTCA     After initial evaluation and assessments completed: Virtual Based Care may be provided alone or in conjunction with Phase 2 Cardiac Rehab based on patient barriers.: Yes   Intensive Cardiac Rehabilitation (ICR) [facility] location only OR Traditional Cardiac Rehabilitation (TCR) *If  criteria for ICR are not met will enroll in TCR [facility] only): Yes        Discharge Medications   Allergies as of 05/26/2023       Reactions   Rivaroxaban Rash   Sulfamethoxazole Rash        Medication List     TAKE these medications    amLODipine 5 MG tablet Commonly known as: NORVASC Take 5 mg by mouth daily.   ascorbic acid 500 MG tablet Commonly known as: VITAMIN C Take 500 mg by mouth 2 (two) times daily.   ASHWAGANDHA PO Take 350 mg by mouth daily.   aspirin EC 81 MG tablet Take 1 tablet (81 mg total) by mouth daily. Swallow whole. What changed: additional instructions   Biotin 16109 MCG Tabs Take 10,000 mcg by mouth daily.   CALCIUM PO Take 905 mg by mouth daily.   cholecalciferol 25 MCG (1000 UNIT) tablet Commonly known as: VITAMIN D3 Take 3,000  Units by mouth daily.   Cinnamon 500 MG Tabs Take 500 mg by mouth daily. Chromium 400 mcg Cinn 250 each   clopidogrel 75 MG tablet Commonly known as: Plavix Take 1 tablet (75 mg total) by mouth daily.   Fish Oil 1000 MG Caps Take 1,000 mg by mouth daily.   Magnesium 100 MG Caps Take 200 mg by mouth 2 (two) times daily.   melatonin 5 MG Tabs Take 5 mg by mouth at bedtime.   nitroGLYCERIN 0.4 MG SL tablet Commonly known as: NITROSTAT Place 0.4 mg under the tongue every 5 (five) minutes as needed for chest pain.   nystatin cream Commonly known as: MYCOSTATIN Apply 1 Application topically 2 (two) times daily as needed for dry skin (Rash).   OVER THE COUNTER MEDICATION Take 500 mg by mouth daily. 250 mg each Beta-Glucans   PROBIOTIC DAILY PO Take 1 tablet by mouth daily.   Refresh Plus 0.5 % Soln Generic drug: Carboxymethylcellulose Sod PF Place 1 drop into both eyes 2 (two) times daily. PF   rosuvastatin 20 MG tablet Commonly known as: CRESTOR Take 1 tablet (20 mg total) by mouth daily. What changed:  medication strength how much to take   sodium chloride 0.65 % Soln nasal spray Commonly known as: OCEAN Place 1 spray into both nostrils daily as needed (Dry nose).   Systane Complete PF 0.6 % Soln Generic drug: Propylene Glycol (PF) Place 1 drop into both eyes in the morning and at bedtime.   THYROID PO Take 1 tablet by mouth daily. Thyroid Support Herbal Supplement   TURMERIC PO Take 1,500 mg by mouth daily. 750 mg each   Zinc 50 MG Tabs Take 50 mg by mouth daily.         Allergies Allergies  Allergen Reactions   Rivaroxaban Rash   Sulfamethoxazole Rash    Outstanding Labs/Studies   FLP/LFTs in 8 weeks   Duration of Discharge Encounter   Greater than 30 minutes including physician time.  Signed, Laverda Page, NP 05/26/2023, 5:02 PM

## 2023-05-26 NOTE — Progress Notes (Signed)
Right radial TR band removed at 1900, gauze dressing applied. Right radial clean,dry, and intact. Patient walked to the bathroom without difficulties.

## 2023-05-29 ENCOUNTER — Encounter (HOSPITAL_COMMUNITY): Payer: Self-pay | Admitting: Cardiology

## 2023-05-29 LAB — POCT ACTIVATED CLOTTING TIME: Activated Clotting Time: 391 s

## 2023-05-30 ENCOUNTER — Telehealth (HOSPITAL_COMMUNITY): Payer: Self-pay

## 2023-05-30 NOTE — Telephone Encounter (Signed)
Per phase I cardiac rehab, fax referral to [facility].

## 2023-06-07 DIAGNOSIS — I200 Unstable angina: Secondary | ICD-10-CM | POA: Diagnosis not present

## 2023-06-07 DIAGNOSIS — Z79899 Other long term (current) drug therapy: Secondary | ICD-10-CM | POA: Diagnosis not present

## 2023-06-07 DIAGNOSIS — E785 Hyperlipidemia, unspecified: Secondary | ICD-10-CM | POA: Diagnosis not present

## 2023-06-07 DIAGNOSIS — I10 Essential (primary) hypertension: Secondary | ICD-10-CM | POA: Diagnosis not present

## 2023-06-07 DIAGNOSIS — Z955 Presence of coronary angioplasty implant and graft: Secondary | ICD-10-CM | POA: Diagnosis not present

## 2023-06-07 DIAGNOSIS — E039 Hypothyroidism, unspecified: Secondary | ICD-10-CM | POA: Diagnosis not present

## 2023-06-08 ENCOUNTER — Ambulatory Visit: Payer: PPO

## 2023-06-08 VITALS — BP 138/80 | HR 90 | Ht 64.6 in | Wt 122.6 lb

## 2023-06-08 DIAGNOSIS — E782 Mixed hyperlipidemia: Secondary | ICD-10-CM

## 2023-06-08 DIAGNOSIS — I2510 Atherosclerotic heart disease of native coronary artery without angina pectoris: Secondary | ICD-10-CM | POA: Diagnosis not present

## 2023-06-08 DIAGNOSIS — I10 Essential (primary) hypertension: Secondary | ICD-10-CM | POA: Diagnosis not present

## 2023-06-08 MED ORDER — LOSARTAN POTASSIUM 25 MG PO TABS: 25.0000 mg | ORAL_TABLET | Freq: Every day | ORAL | 3 refills | Status: DC

## 2023-06-08 NOTE — Patient Instructions (Signed)
Medication Instructions:  Your physician has recommended you make the following change in your medication:   Start Losartan 25 mg daily  *If you need a refill on your cardiac medications before your next appointment, please call your pharmacy*   Lab Work: Your physician recommends that you return for lab work in: CMP and lipids in 4 weeks. You need to have labs done when you are fasting.  You can come Monday through Friday 8:30 am to 12:00 pm and 1:15 to 4:30. You do not need to make an appointment as the order has already been placed.   If you have labs (blood work) drawn today and your tests are completely normal, you will receive your results only by: MyChart Message (if you have MyChart) OR A paper copy in the mail If you have any lab test that is abnormal or we need to change your treatment, we will call you to review the results.   Testing/Procedures: Your physician has requested that you have an echocardiogram. Echocardiography is a painless test that uses sound waves to create images of your heart. It provides your doctor with information about the size and shape of your heart and how well your heart's chambers and valves are working. This procedure takes approximately one hour. There are no restrictions for this procedure. Please do NOT wear cologne, perfume, aftershave, or lotions (deodorant is allowed). Please arrive 15 minutes prior to your appointment time.     Follow-Up: At [facility], you and your health needs are our priority.  As part of our continuing mission to provide you with exceptional heart care, we have created designated Provider Care Teams.  These Care Teams include your primary Cardiologist (physician) and Advanced Practice Providers (APPs -  Physician Assistants and Nurse Practitioners) who all work together to provide you with the care you need, when you need it.  We recommend signing up for the patient portal called "MyChart".  Sign up information is  provided on this After Visit Summary.  MyChart is used to connect with patients for Virtual Visits (Telemedicine).  Patients are able to view lab/test results, encounter notes, upcoming appointments, etc.  Non-urgent messages can be sent to your provider as well.   To learn more about what you can do with MyChart, go to [email or web address].    Your next appointment:   3 month(s)  The format for your next appointment:   In Person  Provider:   Vern Claude Madireddy, MD   Other Instructions Echocardiogram An echocardiogram is a test that uses sound waves (ultrasound) to produce images of the heart. Images from an echocardiogram can provide important information about: Heart size and shape. The size and thickness and movement of your heart's walls. Heart muscle function and strength. Heart valve function or if you have stenosis. Stenosis is when the heart valves are too narrow. If blood is flowing backward through the heart valves (regurgitation). A tumor or infectious growth around the heart valves. Areas of heart muscle that are not working well because of poor blood flow or injury from a heart attack. Aneurysm detection. An aneurysm is a weak or damaged part of an artery wall. The wall bulges out from the normal force of blood pumping through the body. Tell a health care provider about: Any allergies you have. All medicines you are taking, including vitamins, herbs, eye drops, creams, and over-the-counter medicines. Any blood disorders you have. Any surgeries you have had. Any medical conditions you have. Whether you are pregnant or may  be pregnant. What are the risks? Generally, this is a safe test. However, problems may occur, including an allergic reaction to dye (contrast) that may be used during the test. What happens before the test? No specific preparation is needed. You may eat and drink normally. What happens during the test? You will take off your clothes from the waist  up and put on a hospital gown. Electrodes or electrocardiogram (ECG)patches may be placed on your chest. The electrodes or patches are then connected to a device that monitors your heart rate and rhythm. You will lie down on a table for an ultrasound exam. A gel will be applied to your chest to help sound waves pass through your skin. A handheld device, called a transducer, will be pressed against your chest and moved over your heart. The transducer produces sound waves that travel to your heart and bounce back (or "echo" back) to the transducer. These sound waves will be captured in real-time and changed into images of your heart that can be viewed on a video monitor. The images will be recorded on a computer and reviewed by your health care provider. You may be asked to change positions or hold your breath for a short time. This makes it easier to get different views or better views of your heart. In some cases, you may receive contrast through an IV in one of your veins. This can improve the quality of the pictures from your heart. The procedure may vary among health care providers and hospitals.   What can I expect after the test? You may return to your normal, everyday life, including diet, activities, and medicines, unless your health care provider tells you not to do that. Follow these instructions at home: It is up to you to get the results of your test. Ask your health care provider, or the department that is doing the test, when your results will be ready. Keep all follow-up visits. This is important. Summary An echocardiogram is a test that uses sound waves (ultrasound) to produce images of the heart. Images from an echocardiogram can provide important information about the size and shape of your heart, heart muscle function, heart valve function, and other possible heart problems. You do not need to do anything to prepare before this test. You may eat and drink normally. After the  echocardiogram is completed, you may return to your normal, everyday life, unless your health care provider tells you not to do that. This information is not intended to replace advice given to you by your health care provider. Make sure you discuss any questions you have with your health care provider. Document Revised: 03/24/2020 Document Reviewed: 03/24/2020 Elsevier Patient Education  2021 [employer]vier Inc[employer]   Important Information About Sugar

## 2023-06-08 NOTE — Assessment & Plan Note (Signed)
Suboptimal control. Target blood pressure below 130 over 80 mmHg. Continue amlodipine 5 mg once daily Start losartan 25 mg once daily.  Discussed medication effect and potential side effects.  Will monitor renal function and electrolytes with repeat basic metabolic panel in about 4 weeks.

## 2023-06-08 NOTE — Assessment & Plan Note (Addendum)
Doing well since PCI. CCS class II.  Will obtain a transthoracic echocardiogram to assess her baseline cardiac structure and function and to see if there is any LV dysfunction that she sustained because of her underlying CAD.  Continue with dual antiplatelet therapy aspirin 81 mg once daily indefinitely. Continue clopidogrel 75 mg once daily for at least 6 months, we will plan to continue it for 12 months if no significant side effects.  Okay to proceed with dental cleaning workup as being planned.  If anything further advanced required that we will need interruption to antiplatelet therapy, would recommend delaying it for 6 months.  Continue physical activity and exercise as tolerated and increase on an incremental basis.

## 2023-06-08 NOTE — Assessment & Plan Note (Addendum)
No recent lipid panel available for my review, she remembers having 1 back in July through her PCP.  Continue with Crestor 20 mg once daily in the setting of CAD and recent PCI.  Will request results of her prior lipid panel for our records.  Will obtain a lipid panel fasting to be done with her next blood work if nothing recent available.

## 2023-06-08 NOTE — Progress Notes (Signed)
Cardiology Office Note:    Date:  06/08/2023   ID:  Brianna Leonard, DOB [birth year], MRN [id number]  PCP:  Philemon Kingdom, MD  Cardiologist:  Marlyn Corporal Kadey Mihalic, MD    Referring MD: Philemon Kingdom, MD   Follow-up post cath and PCI of mid RCA stent 05-26-2023 History of Present Illness:    Brianna Leonard is a 77 y.o. female here for a follow-up visit after her recent coronary angiogram and PCI of mid RCA with drug-eluting stent on 05-26-2023 at [facility].  She was recently seen 05-25-2023 for initial consult at our office, for symptoms of progressive chest pressure with exertion over the preceding 7 days. Her history is otherwise significant for hypertension, hyperlipidemia, PE in 2018 and continued on 87-month therapy with anticoagulation with Xarelto, breast cancer, meningioma [MRI of the brain October 2023 at [facility] with 1.1 cm left posterior frontal convexity meningioma].  Scheduled for urgent coronary angiogram that showed mid segment focal 95 to 99% stenosis of the RCA and underwent successful PCI of it.  Reports doing well since discharge post cath.  No chest pain. Feels her breathing is improved.  She still feels her capacity is not back to her baseline.  Feels she is slightly short of breath going up a flight of stairs from the basement.  Right radial cath site healed well. No palpitations. Has started cardiac rehab.    Past Medical History:  Diagnosis Date   Aortic atherosclerosis (HCC)    Breast cancer of lower-inner quadrant of right female breast (HCC) 11/04/2016   Hyperlipidemia    Hypertension    Hypothyroidism    Mild Clinical/ not requiring treatment   Ischemic cerebrovascular disease    Meningioma (HCC)    Osteopenia    Pulmonary embolism (HCC) 02/04/2017   Thyroid disease     Past Surgical History:  Procedure Laterality Date   BREAST LUMPECTOMY WITH NEEDLE LOCALIZATION AND AXILLARY SENTINEL LYMPH NODE BX  Right    CORONARY STENT INTERVENTION N/A 05/26/2023   Procedure: CORONARY STENT INTERVENTION;  Surgeon: Yates Decamp, MD;  Location: [facility];  Service: Cardiovascular;  Laterality: N/A;   LEFT HEART CATH AND CORONARY ANGIOGRAPHY N/A 05/26/2023   Procedure: LEFT HEART CATH AND CORONARY ANGIOGRAPHY;  Surgeon: Yates Decamp, MD;  Location: [facility] INV[facility] CV LAB;  Service: Cardiovascular;  Laterality: N/A;   TUBAL LIGATION Bilateral 1980    Current Medications: Current Meds  Medication Sig   amLODipine (NORVASC) 5 MG tablet Take 5 mg by mouth daily.   ascorbic acid (VITAMIN C) 500 MG tablet Take 500 mg by mouth 2 (two) times daily.   ASHWAGANDHA PO Take 350 mg by mouth daily.   aspirin EC 81 MG tablet Take 1 tablet (81 mg total) by mouth daily. Swallow whole.   Biotin 40981 MCG TABS Take 10,000 mcg by mouth daily.   CALCIUM PO Take 905 mg by mouth daily.   Carboxymethylcellulose Sod PF (REFRESH PLUS) 0.5 % SOLN Place 1 drop into both eyes 2 (two) times daily. PF   cholecalciferol (VITAMIN D3) 25 MCG (1000 UNIT) tablet Take 3,000 Units by mouth daily.   Cinnamon 500 MG TABS Take 500 mg by mouth daily. Chromium 400 mcg Cinn 250 each   clopidogrel (PLAVIX) 75 MG tablet Take 1 tablet (75 mg total) by mouth daily.   losartan (COZAAR) 25 MG tablet Take 1 tablet (25 mg total) by mouth daily.   Magnesium 100 MG CAPS Take 200 mg by  mouth 2 (two) times daily.   melatonin 5 MG TABS Take 5 mg by mouth at bedtime.   nitroGLYCERIN (NITROSTAT) 0.4 MG SL tablet Place 0.4 mg under the tongue every 5 (five) minutes as needed for chest pain.   nystatin cream (MYCOSTATIN) Apply 1 Application topically 2 (two) times daily as needed for dry skin (Rash).   Omega-3 Fatty Acids (FISH OIL) 1000 MG CAPS Take 1,000 mg by mouth daily.   OVER THE COUNTER MEDICATION Take 500 mg by mouth daily. 250 mg each Beta-Glucans   Probiotic Product (PROBIOTIC DAILY PO) Take 1 tablet by mouth daily.   Propylene Glycol, PF,  (SYSTANE COMPLETE PF) 0.6 % SOLN Place 1 drop into both eyes in the morning and at bedtime.   rosuvastatin (CRESTOR) 20 MG tablet Take 1 tablet (20 mg total) by mouth daily.   sodium chloride (OCEAN) 0.65 % SOLN nasal spray Place 1 spray into both nostrils daily as needed (Dry nose).   THYROID PO Take 1 tablet by mouth daily. Thyroid Support Herbal Supplement   TURMERIC PO Take 1,500 mg by mouth daily. 750 mg each   Zinc 50 MG TABS Take 50 mg by mouth daily.     Allergies:   Rivaroxaban and Sulfamethoxazole   Social History   Socioeconomic History   Marital status: Married    Spouse name: Not on file   Number of children: 2   Years of education: Not on file   Highest education level: Not on file  Occupational History   Not on file  Tobacco Use   Smoking status: Former    Types: Cigarettes   Smokeless tobacco: Never  Substance and Sexual Activity   Alcohol use: Never   Drug use: Never   Sexual activity: Not on file  Other Topics Concern   Not on file  Social History Narrative   Not on file   Social Determinants of Health   Financial Resource Strain: Not on file  Food Insecurity: Not on file  Transportation Needs: Not on file  Physical Activity: Not on file  Stress: Not on file  Social Connections: Not on file     Family History: The patient's family history includes Breast cancer (age of onset: 65) in her cousin; Cervical cancer (age of onset: 43) in her sister; Heart disease in her brother, brother, father, and mother; Non-Hodgkin's lymphoma (age of onset: 15) in her brother. ROS:   Please see the history of present illness.    All 14 point review of systems negative except as described per history of present illness  EKGs/Labs/Other Studies Reviewed:         Recent Labs: 10/07/2022: ALT 14 05/25/2023: BUN 12; Creatinine, Ser 0.69; Hemoglobin 13.5; Platelets 364; Potassium 4.2; Sodium 141  Recent Lipid Panel No results found for: "CHOL", "TRIG", "HDL",  "CHOLHDL", "VLDL", "LDLCALC", "LDLDIRECT"  Physical Exam:    VS:  BP (!) 142/82   Pulse 90   Ht 5' 4.6" (1.641 m)   Wt 122 lb 9.6 oz (55.6 kg)   SpO2 97%   BMI 20.66 kg/m     Wt Readings from Last 3 Encounters:  06/08/23 122 lb 9.6 oz (55.6 kg)  05/26/23 120 lb (54.4 kg)  05/25/23 123 lb 6.4 oz (56 kg)     GENERAL:  Well nourished, well developed in no acute distress NECK: No JVD; No carotid bruits CARDIAC: RRR, S1 and S2 present, no murmurs, no rubs, no gallops CHEST:  Clear to auscultation without rales,  wheezing or rhonchi  Extremities: No pitting pedal edema. Pulses bilaterally symmetric with radial 2+ and dorsalis pedis 2+ NEUROLOGIC:  Alert and oriented x 3   ASSESSMENT AND PLAN:   Mss. Pagani 77 year old woman with history of CAD s/p PCI of mid RCA May 26, 2023, hypertension, hyperlipidemia, PE in June 2018 and was on anticoagulation for 3 months [this was during her ongoing therapy for breast cancer], breast cancer, meningioma of the brain left posterior frontal convexity  Problem List Items Addressed This Visit     Hyperlipidemia    No recent lipid panel available for my review, she remembers having 1 back in July through her PCP.  Continue with Crestor 20 mg once daily in the setting of CAD and recent PCI.  Will request results of her prior lipid panel for our records.  Will obtain a lipid panel fasting to be done with her next blood work if nothing recent available.      Relevant Medications   losartan (COZAAR) 25 MG tablet   Other Relevant Orders   Comprehensive metabolic panel   Lipid panel   CAD, unstable angina October 2024, s/p PCI of mid RCA 05-26-2023 - Primary    Doing well since PCI. CCS class II.  Will obtain a transthoracic echocardiogram to assess her baseline cardiac structure and function and to see if there is any LV dysfunction that she sustained because of her underlying CAD.  Continue with dual antiplatelet therapy aspirin 81 mg  once daily indefinitely. Continue clopidogrel 75 mg once daily for at least 6 months, we will plan to continue it for 12 months if no significant side effects.  Okay to proceed with dental cleaning workup as being planned.  If anything further advanced required that we will need interruption to antiplatelet therapy, would recommend delaying it for 6 months.  Continue physical activity and exercise as tolerated and increase on an incremental basis.      Relevant Medications   losartan (COZAAR) 25 MG tablet   Other Relevant Orders   Comprehensive metabolic panel   Lipid panel   ECHOCARDIOGRAM COMPLETE   Hypertension    Suboptimal control. Target blood pressure below 130 over 80 mmHg. Continue amlodipine 5 mg once daily Start losartan 25 mg once daily.  Discussed medication effect and potential side effects.  Will monitor renal function and electrolytes with repeat basic metabolic panel in about 4 weeks.       Relevant Medications   losartan (COZAAR) 25 MG tablet   Return to clinic in 3 months  Medication Adjustments/Labs and Tests Ordered: Current medicines are reviewed at length with the patient today.  Concerns regarding medicines are outlined above.  Orders Placed This Encounter  Procedures   Comprehensive metabolic panel   Lipid panel   ECHOCARDIOGRAM COMPLETE   Medication changes:  Meds ordered this encounter  Medications   losartan (COZAAR) 25 MG tablet    Sig: Take 1 tablet (25 mg total) by mouth daily.    Dispense:  90 tablet    Refill:  3    Signed, Kinston Magnan reddy Harmonee Tozer, MD, MPH, [facility] 06/08/2023 1:50 PM    [facility]

## 2023-06-16 ENCOUNTER — Telehealth: Payer: Self-pay

## 2023-06-16 DIAGNOSIS — I200 Unstable angina: Secondary | ICD-10-CM | POA: Diagnosis not present

## 2023-06-16 DIAGNOSIS — Z955 Presence of coronary angioplasty implant and graft: Secondary | ICD-10-CM | POA: Diagnosis not present

## 2023-06-16 DIAGNOSIS — I10 Essential (primary) hypertension: Secondary | ICD-10-CM | POA: Diagnosis not present

## 2023-06-16 DIAGNOSIS — Z79899 Other long term (current) drug therapy: Secondary | ICD-10-CM | POA: Diagnosis not present

## 2023-06-16 DIAGNOSIS — E785 Hyperlipidemia, unspecified: Secondary | ICD-10-CM | POA: Diagnosis not present

## 2023-06-16 DIAGNOSIS — E039 Hypothyroidism, unspecified: Secondary | ICD-10-CM | POA: Diagnosis not present

## 2023-06-16 NOTE — Telephone Encounter (Signed)
   [facility] Pre-operative Risk Assessment    Request for surgical clearance:  What type of surgery is being performed?  Cleaning, X-rays, Fillings Crowns, briges, Extraction, Root Canal and other dental procedures.   When is this surgery scheduled? TBD   What type of clearance is required (medical clearance vs. Pharmacy clearance to hold med vs. Both)? Pharmacy  Are there any medications that need to be held prior to surgery and how long?Not specified. The office is requesting premedications instruction or instructions on meds the patient may need to hold prior to dental work.    Practice name and name of physician performing surgery? Dr. Michel Harrow    What is your office phone number: [phone]    7.   What is your office fax number: [phone]  8.   Anesthesia type (None, local, MAC, general) ? Not specified   Tiburcio Pea Lynnet Hefley 06/16/2023, 9:00 AM  _________________________________________________________________   (provider comments below)

## 2023-06-16 NOTE — Telephone Encounter (Signed)
Pre-op team,   Please inform dental office we do not provide blanket recommendations for dental procedures. If they need recommendations, they will need to clarify what procedure the patient has upcoming.   Thank you!  Etta Grandchild. Jacklynn Dehaas, DNP, NP-C  06/16/2023, 10:44 AM [facility] [address] Office [phone] Fax [phone]

## 2023-06-19 NOTE — Telephone Encounter (Signed)
Requesting office states that patient is having a deep cleaning and no extractions

## 2023-06-20 NOTE — Telephone Encounter (Signed)
   Patient Name: Brianna Leonard  DOB: 1946/06/02 MRN: [id number]  Primary Cardiologist: Marlyn Corporal Madireddy, MD  Chart reviewed as part of pre-operative protocol coverage.   Patient recently underwent percutaneous coronary intervention with stent placement on 05/26/2023. It is recommended she remain on aspirin and Plavix for 6 months without interruption.   SBE prophylaxis is not required for the patient from a cardiac standpoint.  I will route this recommendation to the requesting party via Epic fax function and remove from pre-op pool.  Please call with questions.  Carlos Levering, NP 06/20/2023, 10:00 AM

## 2023-07-17 DIAGNOSIS — E785 Hyperlipidemia, unspecified: Secondary | ICD-10-CM | POA: Diagnosis not present

## 2023-07-17 DIAGNOSIS — I10 Essential (primary) hypertension: Secondary | ICD-10-CM | POA: Diagnosis not present

## 2023-07-17 DIAGNOSIS — E039 Hypothyroidism, unspecified: Secondary | ICD-10-CM | POA: Diagnosis not present

## 2023-07-17 DIAGNOSIS — I200 Unstable angina: Secondary | ICD-10-CM | POA: Diagnosis not present

## 2023-07-17 DIAGNOSIS — Z79899 Other long term (current) drug therapy: Secondary | ICD-10-CM | POA: Diagnosis not present

## 2023-07-17 DIAGNOSIS — Z955 Presence of coronary angioplasty implant and graft: Secondary | ICD-10-CM | POA: Diagnosis not present

## 2023-07-18 ENCOUNTER — Ambulatory Visit: Payer: PPO

## 2023-07-18 DIAGNOSIS — I2510 Atherosclerotic heart disease of native coronary artery without angina pectoris: Secondary | ICD-10-CM

## 2023-07-18 LAB — ECHOCARDIOGRAM COMPLETE
Area-P 1/2: 3.34 cm2
S' Lateral: 2.9 cm

## 2023-07-21 DIAGNOSIS — I351 Nonrheumatic aortic (valve) insufficiency: Secondary | ICD-10-CM

## 2023-07-21 DIAGNOSIS — I340 Nonrheumatic mitral (valve) insufficiency: Secondary | ICD-10-CM | POA: Insufficient documentation

## 2023-07-21 HISTORY — DX: Nonrheumatic mitral (valve) insufficiency: I34.0

## 2023-07-21 HISTORY — DX: Nonrheumatic aortic (valve) insufficiency: I35.1

## 2023-07-27 DIAGNOSIS — L300 Nummular dermatitis: Secondary | ICD-10-CM | POA: Diagnosis not present

## 2023-07-27 DIAGNOSIS — L820 Inflamed seborrheic keratosis: Secondary | ICD-10-CM | POA: Diagnosis not present

## 2023-07-27 DIAGNOSIS — L814 Other melanin hyperpigmentation: Secondary | ICD-10-CM | POA: Diagnosis not present

## 2023-07-27 DIAGNOSIS — D2239 Melanocytic nevi of other parts of face: Secondary | ICD-10-CM | POA: Diagnosis not present

## 2023-07-27 DIAGNOSIS — D225 Melanocytic nevi of trunk: Secondary | ICD-10-CM | POA: Diagnosis not present

## 2023-08-02 ENCOUNTER — Other Ambulatory Visit (HOSPITAL_COMMUNITY): Payer: Self-pay

## 2023-08-11 ENCOUNTER — Other Ambulatory Visit (HOSPITAL_COMMUNITY): Payer: Self-pay

## 2023-08-15 ENCOUNTER — Telehealth: Payer: Self-pay

## 2023-08-15 MED ORDER — CLOPIDOGREL BISULFATE 75 MG PO TABS: 75.0000 mg | ORAL_TABLET | Freq: Every day | ORAL | 2 refills | Status: DC

## 2023-08-15 NOTE — Telephone Encounter (Signed)
Patient called and had some questions regarding a medication she's taking.Brianna Leonard...want to know if she should continue it or stop it

## 2023-08-15 NOTE — Telephone Encounter (Signed)
Pt wanted to know if she needed the Plavix as she is almost out of medication. Advised to continue and RX has been sent.

## 2023-08-18 DIAGNOSIS — Z955 Presence of coronary angioplasty implant and graft: Secondary | ICD-10-CM | POA: Diagnosis not present

## 2023-08-18 DIAGNOSIS — E039 Hypothyroidism, unspecified: Secondary | ICD-10-CM | POA: Diagnosis not present

## 2023-08-18 DIAGNOSIS — Z950 Presence of cardiac pacemaker: Secondary | ICD-10-CM | POA: Diagnosis not present

## 2023-08-18 DIAGNOSIS — I10 Essential (primary) hypertension: Secondary | ICD-10-CM | POA: Diagnosis not present

## 2023-08-18 DIAGNOSIS — Z79899 Other long term (current) drug therapy: Secondary | ICD-10-CM | POA: Diagnosis not present

## 2023-08-18 DIAGNOSIS — E785 Hyperlipidemia, unspecified: Secondary | ICD-10-CM | POA: Diagnosis not present

## 2023-08-18 DIAGNOSIS — I200 Unstable angina: Secondary | ICD-10-CM | POA: Diagnosis not present

## 2023-08-23 DIAGNOSIS — Z1331 Encounter for screening for depression: Secondary | ICD-10-CM | POA: Diagnosis not present

## 2023-08-23 DIAGNOSIS — E039 Hypothyroidism, unspecified: Secondary | ICD-10-CM | POA: Diagnosis not present

## 2023-08-23 DIAGNOSIS — I2510 Atherosclerotic heart disease of native coronary artery without angina pectoris: Secondary | ICD-10-CM | POA: Diagnosis not present

## 2023-08-23 DIAGNOSIS — E2839 Other primary ovarian failure: Secondary | ICD-10-CM | POA: Diagnosis not present

## 2023-08-23 DIAGNOSIS — Z6820 Body mass index (BMI) 20.0-20.9, adult: Secondary | ICD-10-CM | POA: Diagnosis not present

## 2023-08-23 DIAGNOSIS — Z0000 Encounter for general adult medical examination without abnormal findings: Secondary | ICD-10-CM | POA: Diagnosis not present

## 2023-08-23 DIAGNOSIS — R7301 Impaired fasting glucose: Secondary | ICD-10-CM | POA: Diagnosis not present

## 2023-08-23 DIAGNOSIS — I10 Essential (primary) hypertension: Secondary | ICD-10-CM | POA: Diagnosis not present

## 2023-08-23 DIAGNOSIS — Z79899 Other long term (current) drug therapy: Secondary | ICD-10-CM | POA: Diagnosis not present

## 2023-08-23 DIAGNOSIS — I700 Atherosclerosis of aorta: Secondary | ICD-10-CM | POA: Diagnosis not present

## 2023-08-23 DIAGNOSIS — E785 Hyperlipidemia, unspecified: Secondary | ICD-10-CM | POA: Diagnosis not present

## 2023-09-05 DIAGNOSIS — E039 Hypothyroidism, unspecified: Secondary | ICD-10-CM | POA: Insufficient documentation

## 2023-09-05 DIAGNOSIS — E079 Disorder of thyroid, unspecified: Secondary | ICD-10-CM | POA: Insufficient documentation

## 2023-09-08 ENCOUNTER — Ambulatory Visit: Payer: PPO

## 2023-09-08 VITALS — BP 120/74 | HR 74 | Ht 64.0 in | Wt 124.6 lb

## 2023-09-08 DIAGNOSIS — I340 Nonrheumatic mitral (valve) insufficiency: Secondary | ICD-10-CM | POA: Diagnosis not present

## 2023-09-08 DIAGNOSIS — I2510 Atherosclerotic heart disease of native coronary artery without angina pectoris: Secondary | ICD-10-CM

## 2023-09-08 DIAGNOSIS — E782 Mixed hyperlipidemia: Secondary | ICD-10-CM

## 2023-09-08 MED ORDER — ROSUVASTATIN CALCIUM 40 MG PO TABS: 40.0000 mg | ORAL_TABLET | Freq: Every day | ORAL | 3 refills | Status: DC

## 2023-09-08 NOTE — Assessment & Plan Note (Signed)
Recent lipid panel from 08-23-2023 reviewed total cholesterol 185, HDL 85, LDL 80, triglycerides 103. Currently tolerating rosuvastatin dose 20 mg once daily without any side effects. Titrate up the dose to 40 mg to target LDL levels below 70. She is agreeable.

## 2023-09-08 NOTE — Progress Notes (Signed)
Cardiology Consultation:    Date:  09/08/2023   ID:  Brianna Leonard, [place] [birth year], MRN [id number]  PCP:  Brianna Kingdom, MD  Cardiologist:  Brianna Corporal Copeland Lapier, MD   Referring MD: Brianna Kingdom, MD   No chief complaint on file.    ASSESSMENT AND PLAN:   Ms. Brianna Leonard 78 year old woman with history of CAD s/p PCI of mid RCA May 26, 2023 in the setting of progressive symptoms of dyspnea on exertion, also has hypertension, prediabetes, hyperlipidemia, remote history of PE in June 2018 and during ongoing chemotherapy for breast cancer was managed with anticoagulation for 3 months, mild aortic insufficiency, mild mitral insufficiency on echocardiogram December 2024 here for follow-up visit, doing well at baseline.  Recent blood work showing mildly elevated TSH 4.98,   Problem List Items Addressed This Visit     Hyperlipidemia   Recent lipid panel from 08-23-2023 reviewed total cholesterol 185, HDL 85, LDL 80, triglycerides 103. Currently tolerating rosuvastatin dose 20 mg once daily without any side effects. Titrate up the dose to 40 mg to target LDL levels below 70. She is agreeable.       Relevant Medications   rosuvastatin (CRESTOR) 40 MG tablet   CAD, unstable angina October 2024, s/p PCI of mid RCA 05-26-2023 - Primary   Doing well.  No anginal symptoms. Exercise capacity CCS class I. Continue with dual antiplatelet therapy. Continue Plavix 75 mg for at least 12 months tentatively until October 2025.       Relevant Medications   rosuvastatin (CRESTOR) 40 MG tablet   Mild mitral regurgitation by echocardiogram December 2024   Reviewed results from echocardiogram once again and recommended further follow-up with repeat imaging tentatively in 18 months this is good to be around June 2026. Will order this after subsequent follow-up visit in 1 year.       Relevant Medications   rosuvastatin (CRESTOR) 40 MG tablet   Return to clinic tentatively in 1 year or  as needed.   History of Present Illness:    Brianna Leonard is a 78 y.o. female who is being seen today for follow-up. PCP is Brianna Kingdom, MD.  Has history of CAD s/p PCI of mid RCA May 26, 2023 in the setting of acutely progressive symptoms of dyspnea on exertion, hypertension, hyperlipidemia, PE in June 2018 treated with anticoagulation for 3 months [during her therapy for breast cancer], mild aortic insufficiency and mitral insufficiency on echocardiogram December 2024.  Now appears to have prediabetes and mildly elevated TSH levels on recent blood work.   Echocardiogram done 07/18/2023 noted normal biventricular function EF 60 to 65%, grade 1 diastolic dysfunction, mild mitral regurgitation, mild aortic insufficiency.  Here for the visit accompanied by her husband. Mentions she has been doing well.  Did cardiac rehab and found it useful.  She has been keeping herself regularly active and exercising at home.  Has been tolerating meds well.  Blood work from 08/23/2023 total cholesterol 185, HDL 85, LDL 80, triglycerides 103. BUN 10, creatinine 0.65, sodium 137, potassium 5 Normal transaminases and alkaline phosphatase. Hemoglobin 12.8, hematocrit 38.4, WBC 3.6 Platelets 355. TSH 4.98 mildly elevated. Hemoglobin A1c 6.3, suggest prediabetes.  Past Medical History:  Diagnosis Date   Abnormal mammogram of right breast 12/16/2019   Aortic atherosclerosis (HCC)    Breast cancer of lower-inner quadrant of right female breast (HCC) 11/04/2016   CAD, unstable angina October 2024, s/p PCI of mid RCA 05-26-2023 05/26/2023   Chest pain on exertion  05/25/2023   Fat necrosis of right breast 01/05/2021   Hyperlipidemia    Hypertension    Hypothyroidism    Mild Clinical/ not requiring treatment   Ischemic cerebrovascular disease    Meningioma (HCC)    Mild aortic regurgitation by echocardiogram December 2024 07/21/2023   Mild mitral regurgitation by echocardiogram December 2024  07/21/2023   Osteopenia    Pulmonary embolism (HCC) 02/04/2017   Thyroid disease     Past Surgical History:  Procedure Laterality Date   BREAST LUMPECTOMY WITH NEEDLE LOCALIZATION AND AXILLARY SENTINEL LYMPH NODE BX Right    CORONARY STENT INTERVENTION N/A 05/26/2023   Procedure: CORONARY STENT INTERVENTION;  Surgeon: Yates Decamp, MD;  Location: [facility];  Service: Cardiovascular;  Laterality: N/A;   LEFT HEART CATH AND CORONARY ANGIOGRAPHY N/A 05/26/2023   Procedure: LEFT HEART CATH AND CORONARY ANGIOGRAPHY;  Surgeon: Yates Decamp, MD;  Location: [facility];  Service: Cardiovascular;  Laterality: N/A;   TUBAL LIGATION Bilateral 1980    Current Medications: Current Meds  Medication Sig   rosuvastatin (CRESTOR) 40 MG tablet Take 1 tablet (40 mg total) by mouth daily.     Allergies:   Rivaroxaban and Sulfamethoxazole   Social History   Socioeconomic History   Marital status: Married    Spouse name: Not on file   Number of children: 2   Years of education: Not on file   Highest education level: Not on file  Occupational History   Not on file  Tobacco Use   Smoking status: Former    Types: Cigarettes   Smokeless tobacco: Never  Substance and Sexual Activity   Alcohol use: Never   Drug use: Never   Sexual activity: Not on file  Other Topics Concern   Not on file  Social History Narrative   Not on file   Social Drivers of Health   Financial Resource Strain: Not on file  Food Insecurity: Not on file  Transportation Needs: Not on file  Physical Activity: Not on file  Stress: Not on file  Social Connections: Not on file     Family History: The patient's family history includes Breast cancer (age of onset: 67) in her cousin; Cervical cancer (age of onset: 38) in her sister; Heart disease in her brother, brother, father, and mother; Non-Hodgkin's lymphoma (age of onset: 15) in her brother. ROS:   Please see the history of present illness.    All 14  point review of systems negative except as described per history of present illness.  EKGs/Labs/Other Studies Reviewed:    The following studies were reviewed today:   EKG:       Recent Labs: 10/07/2022: ALT 14 05/25/2023: BUN 12; Creatinine, Ser 0.69; Hemoglobin 13.5; Platelets 364; Potassium 4.2; Sodium 141  Recent Lipid Panel No results found for: "CHOL", "TRIG", "HDL", "CHOLHDL", "VLDL", "LDLCALC", "LDLDIRECT"  Physical Exam:    VS:  BP 120/74   Pulse 74   Ht 5\' 4"  (1.626 m)   Wt 124 lb 9.6 oz (56.5 kg)   SpO2 98%   BMI 21.39 kg/m     Wt Readings from Last 3 Encounters:  09/08/23 124 lb 9.6 oz (56.5 kg)  06/08/23 122 lb 9.6 oz (55.6 kg)  05/26/23 120 lb (54.4 kg)     GENERAL:  Well nourished, well developed in no acute distress NECK: No JVD; No carotid bruits CARDIAC: RRR, S1 and S2 present, no murmurs, no rubs, no gallops CHEST:  Clear to auscultation  without rales, wheezing or rhonchi  Extremities: No pitting pedal edema. Pulses bilaterally symmetric with radial 2+ and dorsalis pedis 2+ NEUROLOGIC:  Alert and oriented x 3  Medication Adjustments/Labs and Tests Ordered: Current medicines are reviewed at length with the patient today.  Concerns regarding medicines are outlined above.  No orders of the defined types were placed in this encounter.  Meds ordered this encounter  Medications   rosuvastatin (CRESTOR) 40 MG tablet    Sig: Take 1 tablet (40 mg total) by mouth daily.    Dispense:  90 tablet    Refill:  3    Signed, Waynesha Rammel reddy Leshaun Biebel, MD, MPH, [facility]. 09/08/2023 12:25 PM    [facility]

## 2023-09-08 NOTE — Assessment & Plan Note (Signed)
Doing well.  No anginal symptoms. Exercise capacity CCS class I. Continue with dual antiplatelet therapy. Continue Plavix 75 mg for at least 12 months tentatively until October 2025.

## 2023-09-08 NOTE — Patient Instructions (Signed)
Medication Instructions:  Increase Crestor to 40 mg once a day  *If you need a refill on your cardiac medications before your next appointment, please call your pharmacy*   Lab Work: None Ordered If you have labs (blood work) drawn today and your tests are completely normal, you will receive your results only by: MyChart Message (if you have MyChart) OR A paper copy in the mail If you have any lab test that is abnormal or we need to change your treatment, we will call you to review the results.   Testing/Procedures: None Ordered   Follow-Up: At [facility], you and your health needs are our priority.  As part of our continuing mission to provide you with exceptional heart care, we have created designated Provider Care Teams.  These Care Teams include your primary Cardiologist (physician) and Advanced Practice Providers (APPs -  Physician Assistants and Nurse Practitioners) who all work together to provide you with the care you need, when you need it.  We recommend signing up for the patient portal called "MyChart".  Sign up information is provided on this After Visit Summary.  MyChart is used to connect with patients for Virtual Visits (Telemedicine).  Patients are able to view lab/test results, encounter notes, upcoming appointments, etc.  Non-urgent messages can be sent to your provider as well.   To learn more about what you can do with MyChart, go to [email or web address].    Your next appointment:   1 year

## 2023-09-08 NOTE — Assessment & Plan Note (Signed)
Reviewed results from echocardiogram once again and recommended further follow-up with repeat imaging tentatively in 18 months this is good to be around June 2026. Will order this after subsequent follow-up visit in 1 year.

## 2023-10-02 DIAGNOSIS — D72819 Decreased white blood cell count, unspecified: Secondary | ICD-10-CM | POA: Diagnosis not present

## 2023-10-05 ENCOUNTER — Inpatient Hospital Stay: Payer: PPO | Attending: Oncology | Admitting: Nurse Practitioner

## 2023-10-05 VITALS — BP 163/82 | HR 74 | Temp 98.9°F | Resp 16 | Ht 64.0 in | Wt 124.4 lb

## 2023-10-05 DIAGNOSIS — R0602 Shortness of breath: Secondary | ICD-10-CM | POA: Insufficient documentation

## 2023-10-05 DIAGNOSIS — Z08 Encounter for follow-up examination after completed treatment for malignant neoplasm: Secondary | ICD-10-CM | POA: Diagnosis not present

## 2023-10-05 DIAGNOSIS — Z853 Personal history of malignant neoplasm of breast: Secondary | ICD-10-CM | POA: Insufficient documentation

## 2023-10-05 DIAGNOSIS — G8918 Other acute postprocedural pain: Secondary | ICD-10-CM | POA: Diagnosis not present

## 2023-10-05 DIAGNOSIS — Z86711 Personal history of pulmonary embolism: Secondary | ICD-10-CM | POA: Insufficient documentation

## 2023-10-05 DIAGNOSIS — G8929 Other chronic pain: Secondary | ICD-10-CM | POA: Insufficient documentation

## 2023-10-05 DIAGNOSIS — M8580 Other specified disorders of bone density and structure, unspecified site: Secondary | ICD-10-CM | POA: Diagnosis not present

## 2023-10-05 NOTE — Progress Notes (Signed)
 [facility] at [facility] [address] [place],  [place]  [address] [phone]  Clinic Day:  10/05/23  Referring physician: Philemon Kingdom, MD  CHIEF COMPLAINT:  CC: History of stage IA triple negative right breast cancer & history of PE  Current Treatment:  Surveillance  HISTORY OF PRESENT ILLNESS:  Brianna Leonard is a 78 y.o. female with stage IA (T1b N0 M0) triple negative right breast cancer diagnosed in March 2018.  She was treated with lumpectomy.  Pathology revealed a 1 cm, grade 2, invasive ductal carcinoma.  One sentinel node was negative for metastasis.  Estrogen and progesterone receptors were negative.  HER 2 by IHC was equivocal, but HER 2 by FISH was negative.  Adjuvant chemotherapy was recommended with 4 cycles of docetaxel and cyclophosphamide beginning in May 2018.  Prior to her second cycle in June, she reported episodes of shortness of breath not necessarily associated with exertion, as well as intermittent chest tightness, which occasionally woke her at night, and a sensation of her heart racing. EKG was normal and she was asymptomatic, so we advised her to see Dr. Sudie Bailey if she had recurrent symptoms.  Unfortunately, she did have recurrent dyspnea and CT chest in June revealed a right lower lobe subsegmental pulmonary embolism. Dr. Sudie Bailey placed the patient on rivaroxaban 15 mg twice daily.  The patient then developed a diffuse rash with cephalexin, which she was on for mastitis.  She has had chronic pain in the right breast since surgery.  In August 2018, she reported shortness of breath and a heaviness in her chest again.  CTA chest at that time revealed resolution of the previously seen pulmonary embolism.  Rivaroxaban was discontinued and she was placed on aspirin to 81 mg daily.  She received adjuvant radiation to the right breast, completed in September 2018.  Her port was subsequently removed.  When she was seen in March 2021, she was  doing fairly well.  She had a bilateral diagnostic mammogram in April, which revealed a new area asymmetry with associated calcifications in the lateral right breast.  There were postoperative changes at the right lumpectomy site with an associated seroma, which were stable.  Ultrasound revealed a hypoechoic mass in the right lateral breast, measuring 8 mm in greatest dimension, at 9:30 o'clock 4 cm from the nipple.  Biopsy was recommended.  Pathology revealed breast and adipose tissue with fat necrosis and dense fibrosis.  No atypia, in situ or invasive malignancy was identified.  In July she reported a mass in the right breast near her surgical excision.  Diagnostic right mammogram from July revealed an indeterminate adjacent masses in the upper inner quadrant of the right breast at the 12:30 o'clock position and the 1 o'clock position.  Biopsies from July 28th confirmed fat necrosis with fibrosis and were benign.    4/17-23- MR Breast Bilateral- no interval change in the 2 non-biopsied enhancing masses in medial right breast, suspected to represent fat necrosis. No suspicious changes of previous biopsied areas of right breast. No evidence of left breast malignancy. Bi-Rads Category 3: probably benign  01/28/2023- MR Breast Bilateral - 1. Stable to decrease in size of scattered masses and non mass enhancement in the right breast which given stability for 2 years are considered benign and likely represent fat necrosis. 2. No MRI evidence of malignancy in the left breast. Bi-Rads Category 2: benign   INTERVAL HISTORY:  Patient returns to clinic for continued surveillance of her history of  stage IA triple negative right breast cancer, diagnosed in 2018. She has been NED since that time. She continues to be active, exercising regularly. She has chronic pain on right side particularly when lifting weights. Not worsening. She continues to see Dr. Georgiana Shore for mammograms. She otherwise feels well and denies  complaints. No unintentional weight loss. She has chronic shortness of breath post stenting. Takes plavix. Denies bone pain, headaches. Denies other complaints.   REVIEW OF SYSTEMS:  Review of Systems  Constitutional:  Negative for appetite change, fatigue and unexpected weight change.  HENT:   Negative for mouth sores, sore throat and trouble swallowing.   Respiratory:  Negative for chest tightness and shortness of breath.   Cardiovascular:  Negative for chest pain (post surgical scarring and discomfort with exertion- not worsening) and leg swelling.  Gastrointestinal:  Negative for abdominal pain, blood in stool, constipation, diarrhea, nausea and vomiting.  Genitourinary:  Negative for bladder incontinence, dysuria, hematuria, vaginal bleeding and vaginal discharge.   Musculoskeletal:  Negative for arthralgias, back pain, flank pain, myalgias, neck pain and neck stiffness.  Skin:  Negative for itching, rash and wound.  Neurological:  Negative for dizziness, headaches, light-headedness and numbness.  Hematological:  Negative for adenopathy. Does not bruise/bleed easily.  Psychiatric/Behavioral:  Negative for confusion, depression and sleep disturbance. The patient is not nervous/anxious.     VITALS:  Blood pressure (!) 163/82, pulse 74, temperature 98.9 F (37.2 C), temperature source Oral, resp. rate 16, height 5\' 4"  (1.626 m), weight 124 lb 6.4 oz (56.4 kg), SpO2 100%.  Wt Readings from Last 3 Encounters:  09/08/23 124 lb 9.6 oz (56.5 kg)  06/08/23 122 lb 9.6 oz (55.6 kg)  05/26/23 120 lb (54.4 kg)    Body mass index is 21.35 kg/m.  Performance status (ECOG): 0 - Asymptomatic  PHYSICAL EXAM:  Physical Exam Vitals reviewed.  Constitutional:      Appearance: She is not ill-appearing.  HENT:     Head: Normocephalic.  Eyes:     General: No scleral icterus.    Conjunctiva/sclera: Conjunctivae normal.  Cardiovascular:     Rate and Rhythm: Normal rate and regular rhythm.      Pulses: Normal pulses.  Pulmonary:     Effort: Pulmonary effort is normal. No respiratory distress.     Breath sounds: Normal breath sounds.  Chest:     Chest wall: No mass or deformity.     Comments: Breast exam was performed in seated position. Patient is status post right lumpectomy with a well-healed surgical scar. No evidence of any palpable masses. No evidence of axillary adenopathy. No evidence of any palpable masses or lumps in the left breast. No evidence of left axillary adenopathy Abdominal:     General: There is no distension.     Tenderness: There is no abdominal tenderness. There is no guarding.  Musculoskeletal:        General: No swelling or deformity.     Cervical back: Neck supple.  Lymphadenopathy:     Cervical: No cervical adenopathy.  Skin:    General: Skin is warm and dry.     Coloration: Skin is not pale.     Findings: No rash.  Neurological:     Mental Status: She is alert and oriented to person, place, and time.  Psychiatric:        Mood and Affect: Mood is anxious.        Cognition and Memory: Cognition normal.    LABS:  No labs  on site today  STUDIES:  01/28/23- MR Breast Bilateral IMPRESSION: 1. Stable to decrease in size of scattered masses and non mass enhancement in the right breast which given stability for 2 years are considered benign and likely represent fat necrosis. 2. No MRI evidence of malignancy in the left breast.  Allergies:  Allergies  Allergen Reactions   Rivaroxaban Rash   Sulfamethoxazole Rash    Current Medications: Current Outpatient Medications  Medication Sig Dispense Refill   amLODipine (NORVASC) 5 MG tablet Take 5 mg by mouth daily.     ascorbic acid (VITAMIN C) 500 MG tablet Take 500 mg by mouth 2 (two) times daily.     ASHWAGANDHA PO Take 350 mg by mouth daily.     aspirin EC 81 MG tablet Take 1 tablet (81 mg total) by mouth daily. Swallow whole. 30 tablet 0   Biotin 09811 MCG TABS Take 10,000 mcg by mouth daily.      CALCIUM PO Take 905 mg by mouth daily.     Carboxymethylcellulose Sod PF (REFRESH PLUS) 0.5 % SOLN Place 1 drop into both eyes 2 (two) times daily. PF     cholecalciferol (VITAMIN D3) 25 MCG (1000 UNIT) tablet Take 3,000 Units by mouth daily.     Cinnamon 500 MG TABS Take 500 mg by mouth daily. Chromium 400 mcg Cinn 250 each     clopidogrel (PLAVIX) 75 MG tablet Take 1 tablet (75 mg total) by mouth daily. 90 tablet 2   losartan (COZAAR) 25 MG tablet Take 1 tablet (25 mg total) by mouth daily. 90 tablet 3   Magnesium 100 MG CAPS Take 200 mg by mouth 2 (two) times daily.     melatonin 5 MG TABS Take 5 mg by mouth at bedtime.     nitroGLYCERIN (NITROSTAT) 0.4 MG SL tablet Place 0.4 mg under the tongue every 5 (five) minutes as needed for chest pain.     nystatin cream (MYCOSTATIN) Apply 1 Application topically 2 (two) times daily as needed for dry skin (Rash).     Omega-3 Fatty Acids (FISH OIL) 1000 MG CAPS Take 1,000 mg by mouth daily.     OVER THE COUNTER MEDICATION Take 500 mg by mouth daily. 250 mg each Beta-Glucans     Probiotic Product (PROBIOTIC DAILY PO) Take 1 tablet by mouth daily.     Propylene Glycol, PF, (SYSTANE COMPLETE PF) 0.6 % SOLN Place 1 drop into both eyes in the morning and at bedtime.     rosuvastatin (CRESTOR) 40 MG tablet Take 1 tablet (40 mg total) by mouth daily. 90 tablet 3   sodium chloride (OCEAN) 0.65 % SOLN nasal spray Place 1 spray into both nostrils daily as needed (Dry nose).     THYROID PO Take 1 tablet by mouth daily. Thyroid Support Herbal Supplement     TURMERIC PO Take 1,500 mg by mouth daily. 750 mg each     Zinc 50 MG TABS Take 50 mg by mouth daily.     No current facility-administered medications for this visit.    ASSESSMENT & PLAN:   Assessment:   1.  Stage IA triple negative right breast cancer, diagnosed in March 2018- s/p lumpectomy. Pathology revealed 1 cm, grade 2 invasive ductal carcinoma. One SLN was negative for metastasis. ER/PR  negative. HER2 by IHC was equivocal, HER2 by FISH was negative. Adjuvant chemo recommended. She's had some additional biopsies of right breast which had more recently been stableand unchanged. She will continue screening  mammogram with Dr. Georgiana Shore. We reviewed NCCN surveillance guidelines and survivorship topics today as well as symptoms that would be concerning for reucrrence and warrant sooner return. We will continue annual mammograms and annual visits.  2. Hx of PE- d/t recurrent dyspnea. June 2018 CT revealed RLL subsegmental PE. Received rivaroxaban 15 mg BID. Now discontinued. Monitor.  3. Shortness of breath- likely related to CAD and worsened by anxiety. Encouraged ongoing follow up with cardiology.  4. Post surgical pain- chronic. D/t lumpectomy. Stable.  5. Osteopenia- managed by pcp. Encouraged weight bearing exercise and calcium and vitamin d optimization. May also consider fosamax vs bisphosphonates. Defer to PCP.   Plan: - Follow up with Dr Georgiana Shore for mammogram - rtc in 1 year for reevaluation/surveillance with APP  She verbalizes understanding of and agreement to the plans discussed today. She knows to call the office should any new questions or concerns arise.   Alinda Dooms, NP [facility] [address] [place] [address] Dept: [phone] Dept Fax: [phone]

## 2023-11-01 DIAGNOSIS — E2839 Other primary ovarian failure: Secondary | ICD-10-CM | POA: Diagnosis not present

## 2023-11-07 DIAGNOSIS — M8589 Other specified disorders of bone density and structure, multiple sites: Secondary | ICD-10-CM | POA: Diagnosis not present

## 2023-11-07 DIAGNOSIS — E2839 Other primary ovarian failure: Secondary | ICD-10-CM | POA: Diagnosis not present

## 2023-11-07 DIAGNOSIS — Z13820 Encounter for screening for osteoporosis: Secondary | ICD-10-CM | POA: Diagnosis not present

## 2023-12-08 DIAGNOSIS — H524 Presbyopia: Secondary | ICD-10-CM | POA: Diagnosis not present

## 2023-12-08 DIAGNOSIS — H25812 Combined forms of age-related cataract, left eye: Secondary | ICD-10-CM | POA: Diagnosis not present

## 2023-12-28 DIAGNOSIS — Z1231 Encounter for screening mammogram for malignant neoplasm of breast: Secondary | ICD-10-CM | POA: Diagnosis not present

## 2024-01-16 DIAGNOSIS — Z171 Estrogen receptor negative status [ER-]: Secondary | ICD-10-CM | POA: Diagnosis not present

## 2024-01-16 DIAGNOSIS — C50311 Malignant neoplasm of lower-inner quadrant of right female breast: Secondary | ICD-10-CM | POA: Diagnosis not present

## 2024-01-29 DIAGNOSIS — E538 Deficiency of other specified B group vitamins: Secondary | ICD-10-CM | POA: Diagnosis not present

## 2024-01-29 DIAGNOSIS — Z6821 Body mass index (BMI) 21.0-21.9, adult: Secondary | ICD-10-CM | POA: Diagnosis not present

## 2024-01-29 DIAGNOSIS — M791 Myalgia, unspecified site: Secondary | ICD-10-CM | POA: Diagnosis not present

## 2024-01-29 DIAGNOSIS — R7301 Impaired fasting glucose: Secondary | ICD-10-CM | POA: Diagnosis not present

## 2024-01-29 DIAGNOSIS — I2510 Atherosclerotic heart disease of native coronary artery without angina pectoris: Secondary | ICD-10-CM | POA: Diagnosis not present

## 2024-01-29 DIAGNOSIS — M25512 Pain in left shoulder: Secondary | ICD-10-CM | POA: Diagnosis not present

## 2024-01-29 DIAGNOSIS — E785 Hyperlipidemia, unspecified: Secondary | ICD-10-CM | POA: Diagnosis not present

## 2024-01-29 DIAGNOSIS — I10 Essential (primary) hypertension: Secondary | ICD-10-CM | POA: Diagnosis not present

## 2024-01-29 DIAGNOSIS — R0602 Shortness of breath: Secondary | ICD-10-CM | POA: Diagnosis not present

## 2024-01-29 DIAGNOSIS — E039 Hypothyroidism, unspecified: Secondary | ICD-10-CM | POA: Diagnosis not present

## 2024-01-29 DIAGNOSIS — Z79899 Other long term (current) drug therapy: Secondary | ICD-10-CM | POA: Diagnosis not present

## 2024-01-30 DIAGNOSIS — M25512 Pain in left shoulder: Secondary | ICD-10-CM | POA: Diagnosis not present

## 2024-01-30 DIAGNOSIS — R0602 Shortness of breath: Secondary | ICD-10-CM | POA: Diagnosis not present

## 2024-04-30 DIAGNOSIS — I10 Essential (primary) hypertension: Secondary | ICD-10-CM | POA: Diagnosis not present

## 2024-04-30 DIAGNOSIS — I2510 Atherosclerotic heart disease of native coronary artery without angina pectoris: Secondary | ICD-10-CM | POA: Diagnosis not present

## 2024-04-30 DIAGNOSIS — E785 Hyperlipidemia, unspecified: Secondary | ICD-10-CM | POA: Diagnosis not present

## 2024-04-30 DIAGNOSIS — R0602 Shortness of breath: Secondary | ICD-10-CM | POA: Diagnosis not present

## 2024-04-30 DIAGNOSIS — E039 Hypothyroidism, unspecified: Secondary | ICD-10-CM | POA: Diagnosis not present

## 2024-04-30 DIAGNOSIS — Z6821 Body mass index (BMI) 21.0-21.9, adult: Secondary | ICD-10-CM | POA: Diagnosis not present

## 2024-05-02 ENCOUNTER — Other Ambulatory Visit: Payer: Self-pay

## 2024-07-28 ENCOUNTER — Other Ambulatory Visit: Payer: Self-pay

## 2024-08-15 ENCOUNTER — Other Ambulatory Visit: Payer: Self-pay

## 2024-08-22 ENCOUNTER — Other Ambulatory Visit: Payer: Self-pay

## 2024-08-25 ENCOUNTER — Other Ambulatory Visit: Payer: Self-pay

## 2024-09-03 ENCOUNTER — Other Ambulatory Visit: Payer: Self-pay

## 2024-09-03 MED ORDER — ENOXAPARIN SODIUM 40 MG/0.4ML IJ SOSY
40.0000 mg | PREFILLED_SYRINGE | Freq: Every day | INTRAMUSCULAR | 0 refills | Status: DC
  Filled 2024-09-03: qty 5.6, 14d supply, fill #0

## 2024-09-03 MED ORDER — OXYCODONE HCL 5 MG PO TABS
5.0000 mg | ORAL_TABLET | ORAL | 0 refills | Status: DC | PRN
  Filled 2024-09-03: qty 30, 3d supply, fill #0

## 2024-09-09 ENCOUNTER — Ambulatory Visit

## 2024-09-11 ENCOUNTER — Ambulatory Visit

## 2024-09-11 VITALS — BP 110/76 | HR 75 | Ht 64.0 in | Wt 124.2 lb

## 2024-09-11 DIAGNOSIS — I340 Nonrheumatic mitral (valve) insufficiency: Secondary | ICD-10-CM

## 2024-09-11 DIAGNOSIS — I351 Nonrheumatic aortic (valve) insufficiency: Secondary | ICD-10-CM | POA: Diagnosis not present

## 2024-09-11 DIAGNOSIS — I10 Essential (primary) hypertension: Secondary | ICD-10-CM | POA: Diagnosis not present

## 2024-09-11 DIAGNOSIS — I25118 Atherosclerotic heart disease of native coronary artery with other forms of angina pectoris: Secondary | ICD-10-CM | POA: Diagnosis not present

## 2024-09-11 DIAGNOSIS — E782 Mixed hyperlipidemia: Secondary | ICD-10-CM | POA: Diagnosis not present

## 2024-09-11 NOTE — Progress Notes (Signed)
 "  Cardiology Consultation:    Date:  09/11/2024   ID:  Brianna Leonard, [place] 11/26/1945, MRN [id number]  PCP:  Jefferey Fitch, MD  Cardiologist:  Alean SAUNDERS Syed Zukas, MD   Referring MD: Jefferey Fitch, MD   No chief complaint on file.    ASSESSMENT AND PLAN:   Brianna Leonard 79 year old woman  history of CAD s/p PCI of mid RCA May 26, 2023 in the setting of progressive symptoms of dyspnea on exertion, also has hypertension, prediabetes, hyperlipidemia, remote history of PE in June 2018 and during ongoing chemotherapy for breast cancer was managed with anticoagulation for 3 months, takes over-the-counter thyroid  supplementation, has mild aortic insufficiency, mild mitral insufficiency on echocardiogram December 2024    Here for follow-up visit. Describes symptoms of dyspnea on exertion and atypical chest discomfort at times.  Problem List Items Addressed This Visit       Cardiovascular and Mediastinum   CAD,  s/p unstable angina October 2024, s/p PCI of mid RCA 05-26-2023 - Primary   Mild symptoms of dyspnea on exertion suspicious for stable angina. She also has atypical chest discomfort symptoms. Baseline good functional capacity.  Will further assess with treadmill EKG stress test with nuclear imaging. Will also assess for any structural and functional changes with transthoracic echocardiogram.  Recommended to continue with dual antiplatelet therapy for now. If no significant ischemia burden on stress test, okay to discontinue Plavix .  Continue rosuvastatin . Has sublingual nitroglycerin  to use as needed. Continue amlodipine 5 mg once daily.      Relevant Orders   EKG 12-Lead (Completed)   ECHOCARDIOGRAM COMPLETE   MYOCARDIAL PERFUSION IMAGING   Hypertension   Well-controlled blood pressure. Continue amlodipine 5 mg once daily Continue losartan  25 mg once daily.       Mild mitral regurgitation by echocardiogram December 2024   Follow-up repeat  echocardiogram ordered at this visit given her symptoms of dyspnea on exertion.      Relevant Orders   ECHOCARDIOGRAM COMPLETE   Mild aortic regurgitation by echocardiogram December 2024   Relevant Orders   ECHOCARDIOGRAM COMPLETE     Other   Hyperlipidemia   Last lipid panel reviewed from 08/23/2023. Reportedly had another lipid panel at PCPs office this month, will request for review.  Continue rosuvastatin  40 mg once daily. Target LDL less than 70 mg/dL, ideally will further consider less than 55 mg/dL      Return to clinic tentatively in 2 months.   History of Present Illness:    Brianna Leonard is a 78 y.o. female who is being seen today for follow-up visit. PCP is Jefferey Fitch, MD. Last visit with me in the office was 09/08/2023.  Here for the visit today accompanied by her husband.  Keeps herself busy with day-to-day activities at home and walks regularly.  Has history of CAD s/p PCI of mid RCA May 26, 2023 in the setting of progressive symptoms of dyspnea on exertion, also has hypertension, prediabetes, hyperlipidemia, remote history of PE in June 2018 and during ongoing chemotherapy for breast cancer was managed with anticoagulation for 3 months, mild aortic insufficiency, mild mitral insufficiency on echocardiogram December 2024    EKG in the clinic today shows sinus rhythm heart rate 75/min, PR interval 138 ms, QRS duration 88 ms, QTc 450 ms.  No ischemic changes.  Nonspecific ST-T changes in inferior leads.  Mentions had blood work done recently with PCPs office.  These are not available for my review currently.  Requesting office  to obtain these.  She remembers being told no significant abnormalities on the blood counts and good improvement in her cholesterol numbers.  Thyroid  numbers are reassuring.  He mentions that she has been noticing feeling slightly out of breath when she goes out to walk with her husband for 45-50 minutes coming up to 2 miles.  This  she feels is slight change in comparison to a few months ago. Also noticed feeling tired in occasions while working in the kitchen. Feels there are good days where she can feel energetic throughout the day. Mild chest discomfort during exertion was reported on occasions in the last couple months.  Did not have to take any sublingual nitroglycerin . No consistently reproducible symptoms other than a sense of shortness of breath after walking 45 to 50 minutes.  She self discontinued over-the-counter thyroid  supplementation which she was receiving about 3 to 4 weeks ago.  She describes her symptoms to have preceded discontinuing the thyroid  supplementation.  Mentions mild small bruises over upper and lower extremities consistent with petechiae.  Resolved gradually.  Good compliance with her medications.  Past Medical History:  Diagnosis Date   Abnormal mammogram of right breast 12/16/2019   Aortic atherosclerosis    Breast cancer of lower-inner quadrant of right female breast (HCC) 11/04/2016   CAD,  s/p unstable angina October 2024, s/p PCI of mid RCA 05-26-2023 05/26/2023   Chest pain on exertion 05/25/2023   Fat necrosis of right breast 01/05/2021   Hyperlipidemia    Hypertension    Hypothyroidism    Mild Clinical/ not requiring treatment   Ischemic cerebrovascular disease    Meningioma (HCC)    Mild aortic regurgitation by echocardiogram December 2024 07/21/2023   Mild mitral regurgitation by echocardiogram December 2024 07/21/2023   Osteopenia    Pulmonary embolism (HCC) 02/04/2017   Thyroid  disease     Past Surgical History:  Procedure Laterality Date   BREAST LUMPECTOMY WITH NEEDLE LOCALIZATION AND AXILLARY SENTINEL LYMPH NODE BX Right    CORONARY STENT INTERVENTION N/A 05/26/2023   Procedure: CORONARY STENT INTERVENTION;  Surgeon: Ladona Heinz, MD;  Location: [facility] INVASIVE CV [facility];  Service: Cardiovascular;  Laterality: N/A;   LEFT HEART CATH AND CORONARY ANGIOGRAPHY N/A  05/26/2023   Procedure: LEFT HEART CATH AND CORONARY ANGIOGRAPHY;  Surgeon: Ladona Heinz, MD;  Location: [facility] INVASIVE CV LAB;  Service: Cardiovascular;  Laterality: N/A;   TUBAL LIGATION Bilateral 1980    Current Medications: Active Medications[1]   Allergies:   Rivaroxaban and Sulfamethoxazole   Social History   Socioeconomic History   Marital status: Married    Spouse name: Not on file   Number of children: 2   Years of education: Not on file   Highest education level: Not on file  Occupational History   Not on file  Tobacco Use   Smoking status: Former    Types: Cigarettes   Smokeless tobacco: Never  Substance and Sexual Activity   Alcohol  use: Never   Drug use: Never   Sexual activity: Not on file  Other Topics Concern   Not on file  Social History Narrative   Not on file   Social Drivers of Health   Tobacco Use: Medium Risk (09/11/2024)   Patient History    Smoking Tobacco Use: Former    Smokeless Tobacco Use: Never    Passive Exposure: Not on Actuary Strain: Not on file  Food Insecurity: Not on file  Transportation Needs: Not on file  Physical Activity:  Not on file  Stress: Not on file  Social Connections: Not on file  Depression (EYV7-0): Not on file  Alcohol  Screen: Not on file  Housing: Not on file  Utilities: Not on file  Health Literacy: Not on file     Family History: The patient's family history includes Breast cancer (age of onset: 69) in her cousin; Cervical cancer (age of onset: 61) in her sister; Heart disease in her brother, brother, father, and mother; Non-Hodgkin's lymphoma (age of onset: 67) in her brother. ROS:   Please see the history of present illness.    All 14 point review of systems negative except as described per history of present illness.  EKGs/Labs/Other Studies Reviewed:    The following studies were reviewed today:   EKG:  EKG Interpretation Date/Time:  Wednesday September 11 2024 10:52:20 EST Ventricular  Rate:  75 PR Interval:  138 QRS Duration:  88 QT Interval:  372 QTC Calculation: 415 R Axis:   29  Text Interpretation: Normal sinus rhythm Nonspecific T wave abnormality When compared with ECG of 26-May-2023 15:05, T wave inversion no longer evident in Inferior leads Nonspecific T wave abnormality, worse in Lateral leads Confirmed by Liborio Hai reddy [phone]) on 09/11/2024 10:58:42 AM    Recent Labs: No results found for requested labs within last 365 days.  Recent Lipid Panel No results found for: CHOL, TRIG, HDL, CHOLHDL, VLDL, LDLCALC, LDLDIRECT  Physical Exam:    VS:  BP 110/76   Pulse 75   Ht 5' 4 (1.626 m)   Wt 124 lb 3.2 oz (56.3 kg)   SpO2 99%   BMI 21.32 kg/m     Wt Readings from Last 3 Encounters:  09/11/24 124 lb 3.2 oz (56.3 kg)  10/05/23 124 lb 6.4 oz (56.4 kg)  09/08/23 124 lb 9.6 oz (56.5 kg)     GENERAL:  Well nourished, well developed in no acute distress NECK: No JVD; No carotid bruits CARDIAC: RRR, S1 and S2 present, no murmurs, no rubs, no gallops CHEST:  Clear to auscultation without rales, wheezing or rhonchi  Extremities: No pitting pedal edema. Pulses bilaterally symmetric with radial 2+ and dorsalis pedis 2+.  Small petechiae right forearm. NEUROLOGIC:  Alert and oriented x 3  Medication Adjustments/Labs and Tests Ordered: Current medicines are reviewed at length with the patient today.  Concerns regarding medicines are outlined above.  Orders Placed This Encounter  Procedures   MYOCARDIAL PERFUSION IMAGING   EKG 12-Lead   ECHOCARDIOGRAM COMPLETE   No orders of the defined types were placed in this encounter.   Signed, Hai reddy Libbey Duce, MD, MPH, [facility]. 09/11/2024 11:26 AM    [facility]    [1]  Current Meds  Medication Sig   amLODipine (NORVASC) 5 MG tablet Take 5 mg by mouth daily.   ascorbic acid (VITAMIN C) 500 MG tablet Take 500 mg by mouth 2 (two) times daily.   aspirin  EC 81  MG tablet Take 1 tablet (81 mg total) by mouth daily. Swallow whole.   Biotin 89999 MCG TABS Take 10,000 mcg by mouth daily.   CALCIUM  PO Take 905 mg by mouth daily.   Carboxymethylcellulose Sod PF (REFRESH PLUS) 0.5 % SOLN Place 1 drop into both eyes 2 (two) times daily. PF   cholecalciferol (VITAMIN D3) 25 MCG (1000 UNIT) tablet Take 3,000 Units by mouth daily.   Cinnamon 500 MG TABS Take 500 mg by mouth daily. Chromium 400 mcg Cinn 250 each  clopidogrel  (PLAVIX ) 75 MG tablet TAKE 1 TABLET BY MOUTH EVERY DAY   losartan  (COZAAR ) 25 MG tablet TAKE 1 TABLET (25 MG TOTAL) BY MOUTH DAILY.   Magnesium 100 MG CAPS Take 200 mg by mouth 2 (two) times daily.   melatonin 5 MG TABS Take 5 mg by mouth at bedtime.   nitroGLYCERIN  (NITROSTAT ) 0.4 MG SL tablet Place 0.4 mg under the tongue every 5 (five) minutes as needed for chest pain.   nystatin cream (MYCOSTATIN) Apply 1 Application topically 2 (two) times daily as needed for dry skin (Rash).   Omega-3 Fatty Acids (FISH OIL) 1000 MG CAPS Take 1,000 mg by mouth daily.   OVER THE COUNTER MEDICATION Take 500 mg by mouth daily. 250 mg each Beta-Glucans   Probiotic Product (PROBIOTIC DAILY PO) Take 1 tablet by mouth daily.   Propylene Glycol, PF, (SYSTANE COMPLETE PF) 0.6 % SOLN Place 1 drop into both eyes in the morning and at bedtime.   rosuvastatin  (CRESTOR ) 40 MG tablet Take 1 tablet (40 mg total) by mouth daily. Please keep 1 year follow-up for additional refills.   sodium chloride  (OCEAN) 0.65 % SOLN nasal spray Place 1 spray into both nostrils daily as needed (Dry nose).   TURMERIC PO Take 1,500 mg by mouth daily. 750 mg each   Zinc 50 MG TABS Take 50 mg by mouth daily.   "

## 2024-09-11 NOTE — Addendum Note (Signed)
 Addended by: SHERRE ADE I on: 09/11/2024 11:32 AM   Modules accepted: Orders

## 2024-09-11 NOTE — Assessment & Plan Note (Signed)
 Follow-up repeat echocardiogram ordered at this visit given her symptoms of dyspnea on exertion.

## 2024-09-11 NOTE — Assessment & Plan Note (Signed)
 Last lipid panel reviewed from 08/23/2023. Reportedly had another lipid panel at PCPs office this month, will request for review.  Continue rosuvastatin  40 mg once daily. Target LDL less than 70 mg/dL, ideally will further consider less than 55 mg/dL

## 2024-09-11 NOTE — Assessment & Plan Note (Addendum)
 Mild symptoms of dyspnea on exertion suspicious for stable angina. She also has atypical chest discomfort symptoms. Baseline good functional capacity.  Will further assess with treadmill EKG stress test with nuclear imaging. Will also assess for any structural and functional changes with transthoracic echocardiogram.  Recommended to continue with dual antiplatelet therapy for now. If no significant ischemia burden on stress test, okay to discontinue Plavix .  Continue rosuvastatin . Has sublingual nitroglycerin  to use as needed. Continue amlodipine 5 mg once daily.

## 2024-09-11 NOTE — Patient Instructions (Signed)
 Medication Instructions:  Your physician recommends that you continue on your current medications as directed. Please refer to the Current Medication list given to you today.  *If you need a refill on your cardiac medications before your next appointment, please call your pharmacy*  Lab Work: None If you have labs (blood work) drawn today and your tests are completely normal, you will receive your results only by: MyChart Message (if you have MyChart) OR A paper copy in the mail If you have any lab test that is abnormal or we need to change your treatment, we will call you to review the results.  Testing/Procedures: Your physician has requested that you have an echocardiogram. Echocardiography is a painless test that uses sound waves to create images of your heart. It provides your doctor with information about the size and shape of your heart and how well your hearts chambers and valves are working. This procedure takes approximately one hour. There are no restrictions for this procedure. Please do NOT wear cologne, perfume, aftershave, or lotions (deodorant is allowed). Please arrive 15 minutes prior to your appointment time.  Please note: We ask at that you not bring children with you during ultrasound (echo/ vascular) testing. Due to room size and safety concerns, children are not allowed in the ultrasound rooms during exams. Our front office staff cannot provide observation of children in our lobby area while testing is being conducted. An adult accompanying a patient to their appointment will only be allowed in the ultrasound room at the discretion of the ultrasound technician under special circumstances. We apologize for any inconvenience.    [facility] [address] [place], [place] [address] Phone:  [phone]    Please arrive 15 minutes prior to your appointment time for registration and insurance purposes.  The test will take approximately 3 to  4 hours to complete; you may bring reading material.  If someone comes with you to your appointment, they will need to remain in the main lobby due to limited space in the testing area. **If you are pregnant or breastfeeding, please notify the nuclear lab prior to your appointment**  How to prepare for your Myocardial Perfusion Test: Do not eat or drink 3 hours prior to your test, except you may have water. Do not consume products containing caffeine (regular or decaffeinated) 12 hours prior to your test. (ex: coffee, chocolate, sodas, tea). Do bring a list of your current medications with you.  If not listed below, you may take your medications as normal. Do wear comfortable clothes (no dresses or overalls) and walking shoes, tennis shoes preferred (No heels or open toe shoes are allowed). Do NOT wear cologne, perfume, aftershave, or lotions (deodorant is allowed). If these instructions are not followed, your test will have to be rescheduled.  Please report to [address] for your test.  If you have questions or concerns about your appointment, you can call the [facility] at [phone].  If you cannot keep your appointment, please provide 24 hours notification to the Nuclear Lab, to avoid a possible $50 charge to your account.   Follow-Up: At [facility], you and your health needs are our priority.  As part of our continuing mission to provide you with exceptional heart care, our providers are all part of one team.  This team includes your primary Cardiologist (physician) and Advanced Practice Providers or APPs (Physician Assistants and Nurse Practitioners) who all work together to  provide you with the care you need, when you need it.  Your next appointment:   2 month(s)  Provider:   Alean Kobus, MD    We recommend signing up for the patient portal called MyChart.  Sign up information is provided on this After Visit Summary.   MyChart is used to connect with patients for Virtual Visits (Telemedicine).  Patients are able to view lab/test results, encounter notes, upcoming appointments, etc.  Non-urgent messages can be sent to your provider as well.   To learn more about what you can do with MyChart, go to [email or web address].   Other Instructions None

## 2024-09-11 NOTE — Assessment & Plan Note (Signed)
 Well-controlled blood pressure. Continue amlodipine 5 mg once daily Continue losartan  25 mg once daily.

## 2024-09-13 ENCOUNTER — Telehealth (HOSPITAL_COMMUNITY): Payer: Self-pay | Admitting: *Deleted

## 2024-09-13 ENCOUNTER — Ambulatory Visit

## 2024-09-13 DIAGNOSIS — I340 Nonrheumatic mitral (valve) insufficiency: Secondary | ICD-10-CM

## 2024-09-13 DIAGNOSIS — I25118 Atherosclerotic heart disease of native coronary artery with other forms of angina pectoris: Secondary | ICD-10-CM | POA: Diagnosis not present

## 2024-09-13 DIAGNOSIS — I351 Nonrheumatic aortic (valve) insufficiency: Secondary | ICD-10-CM

## 2024-09-13 NOTE — Telephone Encounter (Signed)
 Left a detailed message on voicemail regarding a STRESS TEST on 09/17/24 at 11:15.

## 2024-09-14 LAB — ECHOCARDIOGRAM COMPLETE
AR max vel: 1.87 cm2
AV Area VTI: 1.81 cm2
AV Area mean vel: 1.82 cm2
AV Mean grad: 2 mmHg
AV Peak grad: 3 mmHg
Ao pk vel: 0.87 m/s
Area-P 1/2: 3.21 cm2
MV M vel: 2.51 m/s
MV Peak grad: 25.2 mmHg
MV VTI: 0.99 cm2
MV Vena cont: 0.3 cm
S' Lateral: 2.7 cm

## 2024-09-15 ENCOUNTER — Other Ambulatory Visit: Payer: Self-pay

## 2024-09-17 ENCOUNTER — Ambulatory Visit

## 2024-09-17 DIAGNOSIS — I25118 Atherosclerotic heart disease of native coronary artery with other forms of angina pectoris: Secondary | ICD-10-CM | POA: Diagnosis not present

## 2024-09-17 MED ORDER — TECHNETIUM TC 99M TETROFOSMIN IV KIT
10.6000 | PACK | Freq: Once | INTRAVENOUS | Status: AC | PRN
  Administered 2024-09-17: 10.6 via INTRAVENOUS

## 2024-09-17 MED ORDER — TECHNETIUM TC 99M TETROFOSMIN IV KIT
30.5000 | PACK | Freq: Once | INTRAVENOUS | Status: AC | PRN
  Administered 2024-09-17: 30.5 via INTRAVENOUS

## 2024-09-18 LAB — MYOCARDIAL PERFUSION IMAGING
Angina Index: 0
Estimated workload: 7
Exercise duration (min): 5 min
Exercise duration (sec): 30 s
LV dias vol: 47 mL (ref 46–106)
LV sys vol: 16 mL
MPHR: 143 {beats}/min
Nuc Stress EF: 65 %
Peak HR: 130 {beats}/min
Percent HR: 90 %
Rest HR: 81 {beats}/min
Rest Nuclear Isotope Dose: 10.6 mCi
SDS: 2
SRS: 3
SSS: 5
Stress Nuclear Isotope Dose: 30.5 mCi
TID: 1.05

## 2024-09-19 ENCOUNTER — Ambulatory Visit: Payer: Self-pay

## 2024-10-04 ENCOUNTER — Ambulatory Visit: Payer: PPO | Admitting: Oncology

## 2024-11-07 ENCOUNTER — Ambulatory Visit
